# Patient Record
Sex: Male | Born: 1980
Health system: Southern US, Community
[De-identification: ages and names within clinical notes are randomized; demographics above are authoritative.]

## PROBLEM LIST (undated history)

## (undated) DIAGNOSIS — I10 Essential (primary) hypertension: Secondary | ICD-10-CM

## (undated) DIAGNOSIS — F419 Anxiety disorder, unspecified: Secondary | ICD-10-CM

## (undated) DIAGNOSIS — F329 Major depressive disorder, single episode, unspecified: Secondary | ICD-10-CM

## (undated) DIAGNOSIS — F32A Depression, unspecified: Secondary | ICD-10-CM

## (undated) HISTORY — DX: Anxiety disorder, unspecified: F41.9

## (undated) HISTORY — DX: Essential (primary) hypertension: I10

## (undated) HISTORY — DX: Depression, unspecified: F32.A

## (undated) HISTORY — DX: Major depressive disorder, single episode, unspecified: F32.9

---

## 2016-02-03 ENCOUNTER — Encounter: Payer: Self-pay | Admitting: Emergency Medicine

## 2016-02-03 ENCOUNTER — Emergency Department
Admission: EM | Admit: 2016-02-03 | Discharge: 2016-02-03 | Disposition: A | Discharge status: Home / Self Care | Payer: Self-pay | Attending: Emergency Medicine | Admitting: Emergency Medicine

## 2016-02-03 ENCOUNTER — Emergency Department: Payer: Self-pay

## 2016-02-03 DIAGNOSIS — F17210 Nicotine dependence, cigarettes, uncomplicated: Secondary | ICD-10-CM | POA: Insufficient documentation

## 2016-02-03 DIAGNOSIS — L02214 Cutaneous abscess of groin: Secondary | ICD-10-CM | POA: Insufficient documentation

## 2016-02-03 LAB — CBC WITH DIFFERENTIAL/PLATELET
Basophils Absolute: 0.1 10*3/uL (ref 0–0.1)
Basophils Relative: 1 %
EOS ABS: 0.1 10*3/uL (ref 0–0.7)
Eosinophils Relative: 1 %
HCT: 42.3 % (ref 40.0–52.0)
HEMOGLOBIN: 14.6 g/dL (ref 13.0–18.0)
LYMPHS ABS: 2.4 10*3/uL (ref 1.0–3.6)
Lymphocytes Relative: 16 %
MCH: 29.4 pg (ref 26.0–34.0)
MCHC: 34.5 g/dL (ref 32.0–36.0)
MCV: 85.2 fL (ref 80.0–100.0)
MONOS PCT: 10 %
Monocytes Absolute: 1.4 10*3/uL — ABNORMAL HIGH (ref 0.2–1.0)
NEUTROS PCT: 72 %
Neutro Abs: 10.6 10*3/uL — ABNORMAL HIGH (ref 1.4–6.5)
Platelets: 229 10*3/uL (ref 150–440)
RBC: 4.96 MIL/uL (ref 4.40–5.90)
RDW: 14.3 % (ref 11.5–14.5)
WBC: 14.6 10*3/uL — ABNORMAL HIGH (ref 3.8–10.6)

## 2016-02-03 LAB — BASIC METABOLIC PANEL
Anion gap: 5 (ref 5–15)
BUN: 8 mg/dL (ref 6–20)
CHLORIDE: 106 mmol/L (ref 101–111)
CO2: 26 mmol/L (ref 22–32)
CREATININE: 0.95 mg/dL (ref 0.61–1.24)
Calcium: 9.3 mg/dL (ref 8.9–10.3)
GFR calc Af Amer: >60 mL/min (ref >60)
GFR calc non Af Amer: >60 mL/min (ref >60)
GLUCOSE: 166 mg/dL — AB (ref 65–99)
Potassium: 3.7 mmol/L (ref 3.5–5.1)
SODIUM: 137 mmol/L (ref 135–145)

## 2016-02-03 MED ORDER — IOPAMIDOL (ISOVUE-300) INJECTION 61%
100.0000 mL | Freq: Once | INTRAVENOUS | Status: AC | PRN
  Administered 2016-02-03: 100 mL via INTRAVENOUS
  Filled 2016-02-03: qty 100

## 2016-02-03 MED ORDER — SULFAMETHOXAZOLE-TRIMETHOPRIM 800-160 MG PO TABS: 1.0000 | ORAL_TABLET | Freq: Two times a day (BID) | ORAL | 0 refills | Status: DC

## 2016-02-03 MED ORDER — LIDOCAINE-EPINEPHRINE (PF) 1 %-1:200000 IJ SOLN
INTRAMUSCULAR | Status: AC
  Filled 2016-02-03: qty 30

## 2016-02-03 MED ORDER — HYDROCODONE-ACETAMINOPHEN 5-325 MG PO TABS: 1.0000 | ORAL_TABLET | ORAL | 0 refills | Status: DC | PRN

## 2016-02-03 MED ORDER — LIDOCAINE-EPINEPHRINE (PF) 1 %-1:200000 IJ SOLN
30.0000 mL | Freq: Once | INTRAMUSCULAR | Status: DC
  Filled 2016-02-03: qty 30

## 2016-02-03 NOTE — ED Triage Notes (Signed)
Boil to perineal area x 5 days.

## 2016-02-03 NOTE — ED Notes (Signed)
Patient transported to CT 

## 2016-02-03 NOTE — ED Provider Notes (Signed)
[facility] Emergency Department Provider Note   ____________________________________________   First MD Initiated Contact with Patient 02/03/16 1105     (approximate)  I have reviewed the triage vital signs and the nursing notes.   HISTORY  Chief Complaint Abscess   HPI TAMOTSU Curtis Parrish is a 35 y.o. male who presents with left groin abscess x5 days. Became more painful to sit and walk 3 days ago. Has a history of boils and ingrown hairs and usually manages at home with alternating hot and cold sitz baths. Tried sitz bath this time, with no improvement. Currently no drainage has been expressed. Fiance states it feels like there may be 2-3 places that are growing together. Denies any fever, dysuria, penile discharge.    History reviewed. No pertinent past medical history.  There are no active problems to display for this patient.   History reviewed. No pertinent surgical history.  Prior to Admission medications   Medication Sig Start Date End Date Taking? Authorizing Provider  HYDROcodone-acetaminophen (NORCO/VICODIN) 5-325 MG tablet Take 1 tablet by mouth every 4 (four) hours as needed for moderate pain. 02/03/16 02/02/17  Chinita Pester, FNP  sulfamethoxazole-trimethoprim (BACTRIM DS,SEPTRA DS) 800-160 MG tablet Take 1 tablet by mouth 2 (two) times daily. 02/03/16   Chinita Pester, FNP    Allergies Aspirin and Nsaids  No family history on file.  Social History Social History  Substance Use Topics  . Smoking status: Current Every Day Smoker    Packs/day: 1.00    Types: Cigarettes  . Smokeless tobacco: Never Used  . Alcohol use No    Review of Systems Constitutional: No fever/chills Cardiovascular: Denies chest pain. Respiratory: Denies shortness of breath. Gastrointestinal: Patient has chronic abdominal pain and issues with bowel movements, has not noted any change.  Genitourinary: Negative for dysuria, decreased stream, or penile discharge.   Musculoskeletal: Negative for back pain. Skin: Negative for rash. Neurological: Negative for headaches, focal weakness or numbness.   ____________________________________________   PHYSICAL EXAM:  VITAL SIGNS: ED Triage Vitals  Enc Vitals Group     BP 02/03/16 1046 (!) 141/88     Pulse Rate 02/03/16 1046 91     Resp 02/03/16 1046 18     Temp 02/03/16 1046 97.7 F (36.5 C)     Temp Source 02/03/16 1046 Oral     SpO2 02/03/16 1046 98 %     Weight 02/03/16 1046 245 lb (111.1 kg)     Height 02/03/16 1046 5\' 11"  (1.803 m)     Head Circumference --      Peak Flow --      Pain Score 02/03/16 1047 10     Pain Loc --      Pain Edu? --      Excl. in GC? --     Constitutional: Alert and oriented. Well appearing and in no acute distress. Eyes: Conjunctivae are normal.  Head: Atraumatic. Neck: No stridor. Supple, full ROM without pain or difficulty Hematological/Lymphatic/Immunilogical: No inguinal LAD Cardiovascular: Normal rate, regular rhythm. Grossly normal heart sounds.  Good peripheral circulation. Respiratory: Normal respiratory effort.  No retractions. Lungs CTAB. Gastrointestinal: Soft and nontender. No distention.  Genitourinary: No penile swelling or discharge noted at urethral meatus. Musculoskeletal: Full ROM in all extremities without pain or difficulty.  Neurologic:  Normal speech and language. No gross focal neurologic deficits are appreciated. No gait instability. Skin:  Skin is warm, dry and intact. 6 cm area of induration in left groin  [phone] INiKentuckDomingo MadM along the left perineum  bordering the left posterior superior scrotum. This is consistent  with an infected/inflamed sebaceous cyst or inclusion cyst.  2. Mildly prominent inguinal lymph nodes that appear reactive.  3. Exam otherwise unremarkable.      Electronically Signed  By: David Ormond M.D.  On: 02/03/2016 13:26    ____________________________________________   PROCEDURES  Procedure(s) performed:   ..Incision and Drainage Date/Time: 02/03/2016 2:20 PM Performed by: Jakyra Kenealy B Authorized by: Brytnee Bechler B   Consent:    Consent obtained:  Verbal   Consent given by:  Patient   Risks discussed:  Bleeding, infection, pain and incomplete drainage   Alternatives discussed:  No  treatment, referral and alternative treatment Location:    Type:  Abscess   Size:  6 cm   Location: left groin. Pre-procedure details:    Skin preparation:  Betadine Anesthesia (see MAR for exact dosages):    Anesthesia method:  Local infiltration   Local anesthetic:  Lidocaine 1% WITH epi Procedure type:    Complexity:  Complex Procedure details:    Needle aspiration: no     Incision types:  Single straight   Scalpel blade:  11   Wound management:  Probed and deloculated   Drainage:  Purulent   Drainage amount:  Copious   Wound treatment:  Wound left open   Packing materials:  1/4 in iodoform gauze Post-procedure details:    Patient tolerance of procedure:  Tolerated well, no immediate complications    Critical Care performed: No  ____________________________________________   INITIAL IMPRESSION / ASSESSMENT AND PLAN / ED COURSE  Pertinent labs & imaging results that were available during my care of the patient were reviewed by me and considered in my medical decision making (see chart for details).  Patient's abscess incised and drained in ED as described above. Patient given prescription for bactrim x 10 days and Norco for pain control (#10 pills, 0 refills). Patient instructed to follow up in 48 hour for packing removal and wound recheck. Patient giveMKindred Hospital SeattlecedoniaENT>nstrucMacThe Surgery And Endoscopy Center LLCeM<MEMMt Pleasant Surgical Centercedoniacene BrawnMarce [phone] SpMarMKhs Ambulatory Surgical Centercedoniay and CompanyawnLLCMercy [facility] Lorne SkBarrington EMerril<MEAMBellevue HospitalcedoniaSe914-S[facility]650-4803agnus IMarEliM [phone] SeMagMarEli Lilly and CompanyBrawnntuLorne SkeBarrSheffieldgton EllisonirMArizona Eye Institute And Cosmetic Laser CenMCraig HospitalcedoniaMENSelect [phone] -Eli Lilly and Companyx FallsT>edHubbellia a plMarcene BProvidencMSutter Medical Center, Sacramentocedonia Medical Centerawnnn<MUnited Surgery Center Orange LLCEASUREMENT>son  wound care. No other emergency medicine complaints at this time.   Clinical Course     ____________________________________________   FINAL CLINICAL IMPRESSION(S) / ED DIAGNOSES  Final diagnoses:  Abscess of left groin      NEW MEDICATIONS STARTED DURING THIS VISIT:  New Prescriptions   HYDROCODONE-ACETAMINOPHEN (NORCO/VICODIN) 5-325 MG TABLET    Take 1 tablet by mouth every 4 (four) hours as needed for moderate pain.   SULFAMETHOXAZOLE-TRIMETHOPRIM (BACTRIM DS,SEPTRA DS) 800-160 MG TABLET    Take 1 tablet by mouth 2 (two) times daily.     Note:  This document was prepared using Dragon  voice recognition software and may include unintentional dictation errors.   Chinita PesterCari B Albaraa Swingle, FNP 02/03/16 1435    Jeanmarie PlantJames A McShane, MD 02/03/16 [phone]

## 2016-02-05 ENCOUNTER — Encounter: Payer: Self-pay | Admitting: Emergency Medicine

## 2016-02-05 ENCOUNTER — Emergency Department
Admission: EM | Admit: 2016-02-05 | Discharge: 2016-02-05 | Disposition: A | Discharge status: Home / Self Care | Payer: Self-pay | Attending: Emergency Medicine | Admitting: Emergency Medicine

## 2016-02-05 DIAGNOSIS — Z09 Encounter for follow-up examination after completed treatment for conditions other than malignant neoplasm: Secondary | ICD-10-CM

## 2016-02-05 DIAGNOSIS — Z4801 Encounter for change or removal of surgical wound dressing: Secondary | ICD-10-CM | POA: Insufficient documentation

## 2016-02-05 DIAGNOSIS — F17210 Nicotine dependence, cigarettes, uncomplicated: Secondary | ICD-10-CM | POA: Insufficient documentation

## 2016-02-05 DIAGNOSIS — Z5189 Encounter for other specified aftercare: Secondary | ICD-10-CM

## 2016-02-05 NOTE — ED Provider Notes (Signed)
[facility] Provider Note ____________________________________________  Time seen: 1318  I have reviewed the triage vital signs and the nursing notes.  HISTORY  Chief Complaint  Wound Check  HPI Curtis Parrish is a 35 y.o. male presents to the ED for recheckof a previous I&D procedure to his scrotal abscess. The wound was appropriately packed and patient was discharged with prescriptions for pain medicine and antibiotics. He denies any interim complaints at this time. He presents now for removal of the packing as directed.  History reviewed. No pertinent past medical history.  There are no active problems to display for this patient.   History reviewed. No pertinent surgical history.  Prior to Admission medications   Medication Sig Start Date End Date Taking? Authorizing Provider  HYDROcodone-acetaminophen (NORCO/VICODIN) 5-325 MG tablet Take 1 tablet by mouth every 4 (four) hours as needed for moderate pain. 02/03/16 02/02/17  Chinita Pesterari B Triplett, FNP  sulfamethoxazole-trimethoprim (BACTRIM DS,SEPTRA DS) 800-160 MG tablet Take 1 tablet by mouth 2 (two) times daily. 02/03/16   Chinita Pesterari B Triplett, FNP    Allergies Aspirin and Nsaids  No family history on file.  Social History Social History  Substance Use Topics  . Smoking status: Current Every Day Smoker    Packs/day: 1.00    Types: Cigarettes  . Smokeless tobacco: Never Used  . Alcohol use No    Review of Systems  Constitutional: Negative for fever. Gastrointestinal: Negative for abdominal pain, vomiting and diarrhea. Genitourinary: Negative for dysuria. Skin: Negative for rash.  ____________________________________________  PHYSICAL EXAM:  VITAL SIGNS: ED Triage Vitals [02/05/16 1141]  Enc Vitals Group     BP (!) 141/94     Pulse Rate 98     Resp 18     Temp 98 F (36.7 C)     Temp Source Oral     SpO2 98 %     Weight 245 lb (111.1 kg)     Height 5\' 11"  (1.803 m)   Head Circumference      Peak Flow      Pain Score 1     Pain Loc      Pain Edu?      Excl. in GC?     Constitutional: Alert and oriented. Well appearing and in no distress. Head: Normocephalic and atraumatic. GU: Left scrotal incision with packing noted. No local erythema is noted superficially. There is deep, induration palpable. Packing is removed without difficulty.  Skin:  Skin is warm, dry and intact. No rash noted. ____________________________________________  INITIAL IMPRESSION / ASSESSMENT AND PLAN / ED COURSE  Patient with an abscess recheck without interim complaints. He is discharged with instructions to continue to dose antibiosis. 2 prescribed, until completion. He is also encouraged to monitor apply warm compresses to promote healing. [facility] will [facility] or return to the ED as needed.  Clinical Course   ____________________________________________  FINAL CLINICAL IMPRESSION(S) / ED DIAGNOSES  Final diagnoses:  Abscess re-check  Encounter for recheck of abscess following incision and drainage      Lissa HoardJenise V Bacon Kiyla Ringler, PA-C 02/05/16 1428    Jene Everyobert Kinner, MD 02/05/16 1501

## 2016-02-05 NOTE — ED Notes (Signed)
Pt in via triage; states he is just here for a wound recheck and for packing to be removed from left groin.  Pt with no complaints at this time.  Pt A/Ox4, no immediate distress noted.

## 2016-02-05 NOTE — Discharge Instructions (Signed)
Keep the wound clean, dry, and covered as necessary. Continued dosing the previously prescribed antibiotic until all pills are gone. [facility].

## 2016-02-05 NOTE — ED Triage Notes (Signed)
Pt comes into the ED via POV for a wound recheck on his groin abscess.  Denies any increased redness, swelling, or tenderness.  Patient has continued his antibiotics but has not completed them yet.

## 2016-09-15 ENCOUNTER — Encounter: Payer: Self-pay | Admitting: Family Medicine

## 2016-09-15 ENCOUNTER — Other Ambulatory Visit: Payer: Self-pay | Admitting: Family Medicine

## 2016-09-15 ENCOUNTER — Ambulatory Visit (INDEPENDENT_AMBULATORY_CARE_PROVIDER_SITE_OTHER): Payer: 59 | Admitting: Family Medicine

## 2016-09-15 VITALS — BP 138/90 | HR 75 | Temp 98.0°F | Resp 16 | Ht 71.0 in | Wt 250.0 lb

## 2016-09-15 DIAGNOSIS — F418 Other specified anxiety disorders: Secondary | ICD-10-CM

## 2016-09-15 DIAGNOSIS — R739 Hyperglycemia, unspecified: Secondary | ICD-10-CM

## 2016-09-15 DIAGNOSIS — Z720 Tobacco use: Secondary | ICD-10-CM

## 2016-09-15 DIAGNOSIS — I10 Essential (primary) hypertension: Secondary | ICD-10-CM | POA: Diagnosis not present

## 2016-09-15 DIAGNOSIS — Z7689 Persons encountering health services in other specified circumstances: Secondary | ICD-10-CM

## 2016-09-15 DIAGNOSIS — Z114 Encounter for screening for human immunodeficiency virus [HIV]: Secondary | ICD-10-CM

## 2016-09-15 DIAGNOSIS — E669 Obesity, unspecified: Secondary | ICD-10-CM

## 2016-09-15 DIAGNOSIS — Z0000 Encounter for general adult medical examination without abnormal findings: Secondary | ICD-10-CM

## 2016-09-15 DIAGNOSIS — F339 Major depressive disorder, recurrent, unspecified: Secondary | ICD-10-CM | POA: Insufficient documentation

## 2016-09-15 DIAGNOSIS — F331 Major depressive disorder, recurrent, moderate: Secondary | ICD-10-CM | POA: Insufficient documentation

## 2016-09-15 MED ORDER — FLUOXETINE HCL 20 MG PO TABS: 20.0000 mg | ORAL_TABLET | Freq: Every day | ORAL | 2 refills | Status: DC

## 2016-09-15 MED ORDER — AMLODIPINE BESYLATE 5 MG PO TABS: 5.0000 mg | ORAL_TABLET | Freq: Every day | ORAL | 2 refills | Status: DC

## 2016-09-15 NOTE — Progress Notes (Signed)
Subjective:    Patient ID: Curtis Parrish, male    DOB: Jan 05, 1981, 36 y.o.   MRN: [id number]  Curtis Parrish is a 36 y.o. 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 Here to establish care and requesting evaluation and doctors note for work for clearance to return to regular duty.  HPI   CHRONIC HTN: Reports diagnosed with HTN over past 3 months, but has had high readings previously  - He went to outside Urgent Care 3 months ago in February 2018 due to spells at work with dizzy spells and felt "near-syncope" but never passed out. They requested blood tests but he did not do because of financial reasons.  - Now he has resumed work, with restrictions based on note written from Urgent Care. However, he is able to perform all of his normal daily job functions regardless of restriction. He has "modified" his activities and movements and is approaching his job more "safely" and is more cautious of his movements, not rushing or over-exerting or improperly lifting (before was triggered by frequent lifting and forward bending in too close succession) Current Meds - Was treated with Amlodipine , given 30 day supply initially but ran out, he got additional medications Amlodipine  from friend but he ran out of this 2 days ago. Did not take pill today. Lifestyle: - Limited regular exercise - Family history of HTN, HLD (father, mother, and sister HTN). No known heart disease or MI. - Caffeine intake high amount with energy drinks previously up to 120 oz daily now down to 64 oz daily, limited water intake still Admits occasional mid chest pressure without pain, worst with exertion, temporary episodes, has had some non-exertional symptoms at times. Has never seen Cardiologist. No history of MI. - Requesting doctors note if cleared to  resume regular duty by 09/22/16 Denies chest pain, dyspnea, HA, edema, dizziness / lightheadedness  History of hyperglycemia - No known history of Pre-DM or DM. Has not had regular medical follow-up, only recent labs available include Hyperglycemia at glucose 166, non fasting in ED with acute abscess infection, admits to high sugar diet as well. - Admits polyuria, he attributes this to the amlodipine BP pill - Familiy history with mother possibly dx DM or Pre-DM  Chronic Anxiety / Depression: - Reviews prior history with problem initially diagnosed >10 years ago, related to triggers of acute stress with divorce and custody battle for children. He had labile mood and mixed symptoms with agitation, with anxiety and depression. Additionally he served some time in [employer], concerns with some things he saw, first time [place] body [place], has had some flashbacks and is concerned about PTSD.  - Previously treated with Zoloft, unable to recall exact dose but was on for months to years, dose up to  by report. Rx by Psychiatry, [place]. Last dose 2011, due to loss of insurance, has been off anti-depressant since then. - He has improved controlling his depression with coping mechanisms, but anxiety is persistently bothering him on regular basis, hard to stay focused and he can dwelling, persistent problem no recent changes - Interested to resume anti-depressant today, thinks he may have "gotten used" to zoloft  PMH - Tobacco Abuse, interested in smoking cessation, failed NRT gum. Agrees to discuss this next visit.  Defer HM for  annual physical next week.  Depression screen PHQ 2/9 09/15/2016  Decreased Interest 1  Down, Depressed, Hopeless 2  PHQ - 2 Score 3  Altered sleeping 3  Tired, decreased energy 2  Change in appetite 2  Feeling bad or failure about yourself  1  Trouble concentrating 1  Moving slowly or fidgety/restless 0  Suicidal thoughts 0  PHQ-9 Score 12    Difficult doing work/chores Somewhat difficult   GAD 7 : Generalized Anxiety Score 09/15/2016  Nervous, Anxious, on Edge 2  Control/stop worrying 1  Worry too much - different things 2  Trouble relaxing 2  Restless 1  Easily annoyed or irritable 2  Afraid - awful might happen 1  Total GAD 7 Score 11  Anxiety Difficulty Somewhat difficult    Past Medical History:  Diagnosis Date  . Anxiety   . Depression   . Hypertension    No past surgical history on file. Social History   Social History  . Marital status: Single    Spouse name: N/A  . Number of children: N/A  . Years of education: N/A   Occupational History  . Not on file.   Social History Main Topics  . Smoking status: Current Every Day Smoker    Packs/day: 1.00    Types: Cigarettes  . Smokeless tobacco: Current User  . Alcohol use Yes  . Drug use: No  . Sexual activity: Not on file   Other Topics Concern  . Not on file   Social History Narrative  . No narrative on file   Family History  Problem Relation Age of Onset  . Diabetes Mother   . Hypertension Sister    No current outpatient prescriptions on file prior to visit.   No current facility-administered medications on file prior to visit.     Review of Systems  Constitutional: Positive for activity change (>3 months with some decreased activity tolerance). Negative for appetite change, chills, diaphoresis, fatigue, fever and unexpected weight change.  HENT: Negative for congestion, hearing loss and sinus pressure.   Eyes: Negative for visual disturbance.  Respiratory: Negative for apnea, cough, chest tightness, shortness of breath and wheezing.   Cardiovascular: Negative for chest pain, palpitations and leg swelling.  Gastrointestinal: Negative for abdominal pain, anal bleeding, blood in stool, constipation, diarrhea, nausea and vomiting.  Endocrine: Positive for polyuria. Negative for cold intolerance.  Genitourinary: Negative for decreased urine  volume, difficulty urinating, dysuria, frequency, hematuria, testicular pain and urgency.       Admits Erectile dysfunction  Musculoskeletal: Negative for arthralgias, back pain, joint swelling and neck pain.  Skin: Negative for rash.  Allergic/Immunologic: Negative for environmental allergies.  Neurological: Positive for dizziness (history of dizzy spells with exertion and/or certain sudden position changes). Negative for weakness, light-headedness, numbness and headaches.  Hematological: Negative for adenopathy.  Psychiatric/Behavioral: Positive for agitation, decreased concentration, dysphoric mood and sleep disturbance. Negative for behavioral problems, confusion, self-injury and suicidal ideas. The patient is nervous/anxious.    Per HPI unless specifically indicated above     Objective:    BP 138/90 (BP Location: Left Arm, Cuff Size: Normal)   Pulse 75   Temp 98 F (36.7 C) (Oral)   Resp 16   Ht  (1.803 m)   Wt 250 lb (113.4 kg)   BMI 34.87 kg/m   Wt Readings from Last 3 Encounters:  09/15/16 250 lb (113.4 kg)  02/05/16 245 lb (111.1 kg)  02/03/16 245 lb (111.1 kg)    Physical  Exam  Constitutional: He is oriented to person, place, and time. He appears well-developed and well-nourished. No distress.  Well-appearing, comfortable, cooperative, obese  HENT:  Head: Normocephalic and atraumatic.  Mouth/Throat: Oropharynx is clear and moist.  Poor dentition, some missing teeth other decay  Frontal / maxillary sinuses non-tender. Nares patent without purulence or edema. Bilateral TMs clear without erythema, effusion or bulging. Oropharynx mild dry mucus mem, clear without erythema, exudates, edema or asymmetry.  Eyes: Conjunctivae and EOM are normal. Pupils are equal, round, and reactive to light.  Neck: Normal range of motion. Neck supple. No thyromegaly present.  No carotid bruits  Cardiovascular: Normal rate, regular rhythm, normal heart sounds and intact distal pulses.    No murmur heard. Pulmonary/Chest: Effort normal and breath sounds normal. No respiratory distress. He has no wheezes. He has no rales.  Abdominal: Soft. He exhibits no distension.  Musculoskeletal: Normal range of motion. He exhibits no edema.  Lymphadenopathy:    He has no cervical adenopathy.  Neurological: He is alert and oriented to person, place, and time.  Distal sensation to light touch intact  Skin: Skin is warm and dry. No rash noted. He is not diaphoretic. No erythema.  Psychiatric: He has a normal mood and affect. His behavior is normal.  Well groomed, good eye contact, normal speech and thoughts, does not appear anxious  Nursing note and vitals reviewed.    I have personally reviewed the following lab results from 01/2016.  Results for orders placed or performed during the hospital encounter of 02/03/16  CBC with Differential  Result Value Ref Range   WBC 14.6 (H) 3.8 - 10.6 K/uL   RBC 4.96 4.40 - 5.90 MIL/uL   Hemoglobin 14.6 13.0 - 18.0 g/dL   HCT 16.1 09.6 - 04.5 %   MCV 85.2 80.0 - 100.0 fL   MCH 29.4 26.0 - 34.0 pg   MCHC 34.5 32.0 - 36.0 g/dL   RDW 40.9 81.1 - 91.4 %   Platelets 229 150 - 440 K/uL   Neutrophils Relative % 72 %   Neutro Abs 10.6 (H) 1.4 - 6.5 K/uL   Lymphocytes Relative 16 %   Lymphs Abs 2.4 1.0 - 3.6 K/uL   Monocytes Relative 10 %   Monocytes Absolute 1.4 (H) 0.2 - 1.0 K/uL   Eosinophils Relative 1 %   Eosinophils Absolute 0.1 0 - 0.7 K/uL   Basophils Relative 1 %   Basophils Absolute 0.1 0 - 0.1 K/uL  Basic metabolic panel  Result Value Ref Range   Sodium 137 135 - 145 mmol/L   Potassium 3.7 3.5 - 5.1 mmol/L   Chloride 106 101 - 111 mmol/L   CO2 26 22 - 32 mmol/L   Glucose, Bld 166 (H) 65 - 99 mg/dL   BUN 8 6 - 20 mg/dL   Creatinine, Ser 7.82 0.61 - 1.24 mg/dL   Calcium 9.3 8.9 - 95.6 mg/dL   GFR calc non Af Amer >60 >60 mL/min   GFR calc Af Amer >60 >60 mL/min   Anion gap 5 5 - 15      Assessment & Plan:   Problem List Items  Addressed This Visit    Tobacco abuse    Discuss at next visit for more smoking cessation management, prior failed NRT with gum.      Obesity (BMI 30.0-34.9)    Weight gain +5  Lbs in 8 months, some fluctuation, attributed to poor lifestyle Encouraged lifestyle changes, stop energy drinks, reduce portion,  inc water, start regular exercise Check fasting labs, follow-up 1 week annual physical      Hypertension - Primary    Initial BP elevated, improved on manual re-check, currently off anti-HTN med (ran out, was only 30 day supply in 06/2016, then taking friends medicine), by history chronic HTN uncontrolled due to lack of ins and medical care, family history strong HTN. - Concern with mixed exertional symptoms at work, with dizzy spells vs lightheaded, vs transient chest pressure, seems improved on anti-HTN therapy and with activity modification - No known complications, no recent labs - Asymptomatic currently  Plan: 1. Refill Amlodipine  daily #30 day +2 refills for now 2. Request to keep BP log outside office, bring to next visit - if >140/90 consistent can increase dose as tolerated 3. Encourage improve diet and exercise as tolerated, strong emphasis on reducing energy drinks completely if possible, may contribute to his current symptoms and raise BP 4. Since functioning currently at job by report with improved symptoms now on anti-HTN therapy, agree to release him back to full duty starting 09/22/16, he is to return soon within 1 week with fasting labs, and Annual Physical before returning for re-evaluation. Discussed concerns with his prior exertional symptoms would recommend referral to Cardiology for stress testing and cardiac evaluation. He agrees to this in near future but declines referral at this time.      Relevant Medications   amLODipine (NORVASC) 5 MG tablet   Hyperglycemia    Limited history, prior non fasting glucose 166 ED with acute infection No prior A1c Possible  Mother with DM/PreDM Check fasting labs within 1 week, including A1c chemistry      Anxiety associated with depression    Chronic anxiety vs GAD with associated depression vs possible PTSD. - Triggers with life stressors, divorce, custody, military flashbacks, financial -GAD7: 11, somewhat difficult / PHQ9: 12 somewhat difficult - Prior med: Zoloft reported up to  last dose 2011 (ineffective after a while) - [place] psychiat[place] of state  Plan: 1. Discussion on anxiety/depression, management, complications, concerns with PTSD 2. Start Fluoxetine  daily AM with food, counseling on potential side effects risks, reviewed possible GI intolerance, insomnia (although likely to improve this given anxiety likely source of insomnia), sexual dysfunction (already has ED), reviewed black box warning inc suicidal (no prior history, unlikely concern) - anticipate 4-6 weeks for notable effect, may need titrate dose to 20 in future 3. Advised recommend therapy / counseling in future - if needed will consider Psychiatry referral 4. Follow-up 1 week for annual physical, notify office 4-6 week if need to adjust med      Relevant Medications   FLUoxetine (PROZAC) 20 MG tablet    Other Visit Diagnoses    Encounter to establish care with new doctor          Meds ordered this encounter  Medications  . DISCONTD: amLODipine (NORVASC) 5 MG tablet    Sig: Take 5 mg by mouth daily.  Marland Kitchen amLODipine (NORVASC) 5 MG tablet    Sig: Take 1 tablet (5 mg total) by mouth daily.    Dispense:  30 tablet    Refill:  2  . FLUoxetine (PROZAC) 20 MG tablet    Sig: Take 1 tablet (20 mg total) by mouth daily.    Dispense:  30 tablet    Refill:  2      Follow up plan: Return in about 1 week (around 09/22/2016) for  week for Annual Physical, prefer Tuesday  Sep 21 2016.  Saralyn Pilar, DO [facility] 09/16/2016, 12:26 AM

## 2016-09-15 NOTE — Patient Instructions (Signed)
Thank you for coming to the clinic today.  1. New medications - Refilled Amlodipine take daily - if can check BP few times week if possible, if >140/90 consistently, let our office know, may need to double dose up to  daily  2. Blood work tomorrow, fasting, [facility] - will review results at next apt in 1 week  3. Start Fluoxetine  daily - can cause some stomach upset, take with food, may take 4-6 weeks for full effect, in future may adjust dose - If not making progress consider other medications, also Psychiatry if needed for PTSD  Please schedule a follow-up appointment with Dr. Althea Charon in 1 week for Annual Physical, prefer Tuesday Sep 21 2016  If you have any other questions or concerns, please feel free to call the clinic or send a message through MyChart. You may also schedule an earlier appointment if necessary.  Saralyn Pilar, DO [facility], [place]

## 2016-09-16 ENCOUNTER — Encounter: Payer: Self-pay | Admitting: Family Medicine

## 2016-09-16 DIAGNOSIS — N529 Male erectile dysfunction, unspecified: Secondary | ICD-10-CM | POA: Insufficient documentation

## 2016-09-16 DIAGNOSIS — I10 Essential (primary) hypertension: Secondary | ICD-10-CM | POA: Diagnosis not present

## 2016-09-16 DIAGNOSIS — Z0000 Encounter for general adult medical examination without abnormal findings: Secondary | ICD-10-CM | POA: Diagnosis not present

## 2016-09-16 NOTE — Assessment & Plan Note (Signed)
Chronic anxiety vs GAD with associated depression vs possible PTSD. - Triggers with life stressors, divorce, custody, military flashbacks, financial -GAD7: 11, somewhat difficult / PHQ9: 12 somewhat difficult - Prior med: Zoloft reported up to 200mg  last dose 2011 (ineffective after a while) - [place] psychiat[place] of state  Plan: 1. Discussion on anxiety/depression, management, complications, concerns with PTSD 2. Start Fluoxetine 20mg  daily AM with food, counseling on potential side effects risks, reviewed possible GI intolerance, insomnia (although likely to improve this given anxiety likely source of insomnia), sexual dysfunction (already has ED), reviewed black box warning inc suicidal (no prior history, unlikely concern) - anticipate 4-6 weeks for notable effect, may need titrate dose to 20 in future 3. Advised recommend therapy / counseling in future - if needed will consider Psychiatry referral 4. Follow-up 1 week for annual physical, notify office 4-6 week if need to adjust med

## 2016-09-16 NOTE — Assessment & Plan Note (Signed)
Discuss at next visit for more smoking cessation management, prior failed NRT with gum.

## 2016-09-16 NOTE — Assessment & Plan Note (Signed)
Weight gain +5  Lbs in 8 months, some fluctuation, attributed to poor lifestyle Encouraged lifestyle changes, stop energy drinks, reduce portion, inc water, start regular exercise Check fasting labs, follow-up 1 week annual physical

## 2016-09-16 NOTE — Assessment & Plan Note (Signed)
Limited history, prior non fasting glucose 166 ED with acute infection No prior A1c Possible Mother with DM/PreDM Check fasting labs within 1 week, including A1c chemistry

## 2016-09-16 NOTE — Assessment & Plan Note (Addendum)
Initial BP elevated, improved on manual re-check, currently off anti-HTN med (ran out, was only 30 day supply in 06/2016, then taking friends medicine), by history chronic HTN uncontrolled due to lack of ins and medical care, family history strong HTN. - Concern with mixed exertional symptoms at work, with dizzy spells vs lightheaded, vs transient chest pressure, seems improved on anti-HTN therapy and with activity modification - No known complications, no recent labs - Asymptomatic currently  Plan: 1. Refill Amlodipine 5mg  daily #30 day +2 refills for now 2. Request to keep BP log outside office, bring to next visit - if >140/90 consistent can increase dose as tolerated 3. Encourage improve diet and exercise as tolerated, strong emphasis on reducing energy drinks completely if possible, may contribute to his current symptoms and raise BP 4. Since functioning currently at job by report with improved symptoms now on anti-HTN therapy, agree to release him back to full duty starting 09/22/16, he is to return soon within 1 week with fasting labs, and Annual Physical before returning for re-evaluation. Discussed concerns with his prior exertional symptoms would recommend referral to Cardiology for stress testing and cardiac evaluation. He agrees to this in near future but declines referral at this time.

## 2016-09-17 ENCOUNTER — Telehealth: Payer: Self-pay

## 2016-09-17 LAB — COMPREHENSIVE METABOLIC PANEL
A/G RATIO: 1.3 (ref 1.2–2.2)
ALK PHOS: 82 IU/L (ref 39–117)
ALT: 40 IU/L (ref 0–44)
AST: 20 IU/L (ref 0–40)
Albumin: 4.3 g/dL (ref 3.5–5.5)
BUN/Creatinine Ratio: 10 (ref 9–20)
BUN: 9 mg/dL (ref 6–20)
Bilirubin Total: 0.3 mg/dL (ref 0.0–1.2)
CALCIUM: 9.7 mg/dL (ref 8.7–10.2)
CO2: 24 mmol/L (ref 18–29)
Chloride: 99 mmol/L (ref 96–106)
Creatinine, Ser: 0.93 mg/dL (ref 0.76–1.27)
GFR calc Af Amer: 120 mL/min/{1.73_m2} (ref >59)
GFR, EST NON AFRICAN AMERICAN: 104 mL/min/{1.73_m2} (ref >59)
Globulin, Total: 3.3 g/dL (ref 1.5–4.5)
Glucose: 124 mg/dL — ABNORMAL HIGH (ref 65–99)
POTASSIUM: 4.5 mmol/L (ref 3.5–5.2)
Sodium: 138 mmol/L (ref 134–144)
Total Protein: 7.6 g/dL (ref 6.0–8.5)

## 2016-09-17 LAB — CBC WITH DIFFERENTIAL/PLATELET
Basophils Absolute: 0.1 10*3/uL (ref 0.0–0.2)
Basos: 1 %
EOS (ABSOLUTE): 0.3 10*3/uL (ref 0.0–0.4)
EOS: 3 %
HEMOGLOBIN: 15 g/dL (ref 13.0–17.7)
Hematocrit: 42.8 % (ref 37.5–51.0)
IMMATURE GRANS (ABS): 0 10*3/uL (ref 0.0–0.1)
IMMATURE GRANULOCYTES: 0 %
LYMPHS: 37 %
Lymphocytes Absolute: 3.4 10*3/uL — ABNORMAL HIGH (ref 0.7–3.1)
MCH: 29 pg (ref 26.6–33.0)
MCHC: 35 g/dL (ref 31.5–35.7)
MCV: 83 fL (ref 79–97)
MONOCYTES: 10 %
Monocytes Absolute: 0.9 10*3/uL (ref 0.1–0.9)
NEUTROS PCT: 49 %
Neutrophils Absolute: 4.5 10*3/uL (ref 1.4–7.0)
Platelets: 296 10*3/uL (ref 150–379)
RBC: 5.18 x10E6/uL (ref 4.14–5.80)
RDW: 13.1 % (ref 12.3–15.4)
WBC: 9.1 10*3/uL (ref 3.4–10.8)

## 2016-09-17 LAB — HEMOGLOBIN A1C
Est. average glucose Bld gHb Est-mCnc: 166 mg/dL
Hgb A1c MFr Bld: 7.4 % — ABNORMAL HIGH (ref 4.8–5.6)

## 2016-09-17 LAB — LIPID PANEL
CHOL/HDL RATIO: 6 ratio — AB (ref 0.0–5.0)
CHOLESTEROL TOTAL: 187 mg/dL (ref 100–199)
HDL: 31 mg/dL — AB (ref >39)
LDL Calculated: 124 mg/dL — ABNORMAL HIGH (ref 0–99)
TRIGLYCERIDES: 161 mg/dL — AB (ref 0–149)
VLDL Cholesterol Cal: 32 mg/dL (ref 5–40)

## 2016-09-17 LAB — HIV ANTIBODY (ROUTINE TESTING W REFLEX): HIV SCREEN 4TH GENERATION: NONREACTIVE

## 2016-09-17 NOTE — Telephone Encounter (Signed)
Patient advised as below. Patient verbalizes understanding and is in agreement with treatment plan.  

## 2016-09-17 NOTE — Telephone Encounter (Signed)
-----   Message from Smitty CordsAlexander J Karamalegos, DO sent at 09/17/2016  6:59 AM EDT ----- Lab results released to MyChart with comments for patient.  Will review in detail at office visit next week.  1. Chemistry panel - Normal electrolytes, kidney and liver function. Fasting blood sugar was 124 (elevated).  2. Hemoglobin A1c (Diabetes Screening) - 7.4. Abnormal result, this is actually in the "Diabetes" range, >6.5. I am concerned you may have Type 2 Diabetes, however we need to confirm this with a repeat A1c test in about 1-2 weeks to see if the result was accurate. That single test will not require you to be fasting, we can order lab at next visit and you may go to [facility].  3. Routine HIV Screen - Negative.  4. Cholesterol (Lipid) Panel - Abnormal results. We can review cholesterol in office. I do recommend starting daily OTC baby Aspirin 81mg  daily to reduce risk of heart attack and stroke for now.  5. CBC Blood Count - Normal  Saralyn PilarAlexander Karamalegos, DO [facility] 09/17/2016, 6:59 AM

## 2016-09-21 ENCOUNTER — Encounter: Payer: Self-pay | Admitting: Family Medicine

## 2016-09-21 ENCOUNTER — Ambulatory Visit (INDEPENDENT_AMBULATORY_CARE_PROVIDER_SITE_OTHER): Payer: 59 | Admitting: Family Medicine

## 2016-09-21 VITALS — BP 136/83 | HR 81 | Temp 98.2°F | Resp 16 | Ht 71.0 in | Wt 252.0 lb

## 2016-09-21 DIAGNOSIS — E1165 Type 2 diabetes mellitus with hyperglycemia: Secondary | ICD-10-CM | POA: Insufficient documentation

## 2016-09-21 DIAGNOSIS — N5089 Other specified disorders of the male genital organs: Secondary | ICD-10-CM

## 2016-09-21 DIAGNOSIS — I10 Essential (primary) hypertension: Secondary | ICD-10-CM | POA: Diagnosis not present

## 2016-09-21 DIAGNOSIS — F418 Other specified anxiety disorders: Secondary | ICD-10-CM | POA: Diagnosis not present

## 2016-09-21 DIAGNOSIS — L732 Hidradenitis suppurativa: Secondary | ICD-10-CM

## 2016-09-21 DIAGNOSIS — Z720 Tobacco use: Secondary | ICD-10-CM | POA: Diagnosis not present

## 2016-09-21 DIAGNOSIS — E782 Mixed hyperlipidemia: Secondary | ICD-10-CM

## 2016-09-21 DIAGNOSIS — R7309 Other abnormal glucose: Secondary | ICD-10-CM

## 2016-09-21 DIAGNOSIS — Z0000 Encounter for general adult medical examination without abnormal findings: Secondary | ICD-10-CM | POA: Diagnosis not present

## 2016-09-21 MED ORDER — DOXYCYCLINE HYCLATE 100 MG PO TABS: 100.0000 mg | ORAL_TABLET | Freq: Two times a day (BID) | ORAL | 0 refills | Status: DC

## 2016-09-21 NOTE — Patient Instructions (Addendum)
Thank you for coming to the clinic today.  1. - Continue Amlodipine 5mg  daily - keep checking BP few times a week if possible once you get your cuff, if >140/90 consistently, let our office know, may need to double dose up to 10mg  daily (2 pills of the 5mg  at once for 1-2 weeks)  For cyst - Start antibiotic Doxycycline twice daily for 10 days, if worsening redness, swelling, more pus, fevers/chills, or other concerns, let me know or seek more immediate treatment  Regarding question on shingles, if wife's lesions rash have resolved, then you are at very low risk for transmission, as you had chicken pox already exposed, immunocompromise will increase your risk  Continue Prozac as you are for 4-6 weeks, in future we can adjust, may call office if need to extend this rx or change dose  Think about setting quit date for smoking, likely Fall 2018, and we can revisit Wellbutrin  2. Concern with Elevated A1c (in range for Diabetes), printed NEW order for [facility] - Hemoglobin A1c - please go again NON Fasting, anytime in next 1-2 weeks - If still in diabetes range, then will send new rx Metformin 500mg  1-2 times daily with food, if upset stomach then stay at 1 dose daily for few weeks, if still cannot tolerate even once daily, notify office and we can change to XL 24 hour pill that is better tolerated. - WIll also take significant lifestyle changes as well - in future can refer to Nutritionist  Eat at least 3 meals and 1-2 snacks per day (don't skip breakfast).  Aim for no more than 5 hours between eating. - Tip: If you go >5 hours without eating and become very hungry, your body will supply it's own resources temporarily and you can gain extra weight when you eat.  The 5 Minute Rule of Exercise - Promise yourself to at least do 5 minutes of exercise (make sure you time it), and if at the end of 5 minutes (this is the hardest part of the work-out), if you still feel like you want stop (or not motivated  to continue) then allow yourself to stop. Otherwise, more often than not you will feel encouraged that you can continue for a little while longer or even more!  Diet Recommendations for Diabetes   Reduce Starchy (carb) foods include: Bread, rice, pasta, potatoes, corn, crackers, bagels, muffins, all baked goods.   Keep eating Protein foods include: Meat, fish, poultry, eggs, dairy foods, and beans such as pinto and kidney beans (beans also provide carbohydrate).   1. Eat at least 3 meals and 1-2 snacks per day. Never go more than 4-5 hours while awake without eating.   2. Limit starchy foods to TWO per meal and ONE per snack. ONE portion of a starchy  food is equal to the following:   - ONE slice of bread (or its equivalent, such as half of a hamburger bun).   - 1/2 cup of a "scoopable" starchy food such as potatoes or rice.   - 1 OUNCE (28 grams) of starchy snacks (crackers or pretzels, look on label).   - 15 grams of carbohydrate as shown on food label.   3. Both lunch and dinner should include a protein food, a carb food, and vegetables.   - Obtain twice as many veg's as protein or carbohydrate foods for both lunch and dinner.   - Try to keep frozen veg's on hand for a quick vegetable serving.     -  Fresh or frozen veg's are best.   4. Breakfast should always include protein.    Please schedule a follow-up appointment with Dr. Althea CharonKaramalegos in 4-6 weeks as needed for Anxiety/Depression / HTN / Blood Sugar / OR if doing better you can follow-up in 3 months for Anxiety/Depression / Smoking OR HTN / DM A1c  If you have any other questions or concerns, please feel free to call the clinic or send a message through MyChart. You may also schedule an earlier appointment if necessary.  Saralyn PilarAlexander Karamalegos, DO [facility], [place]

## 2016-09-21 NOTE — Progress Notes (Signed)
Subjective:    Patient ID: Curtis Parrish, male    DOB: 1980-05-30, [age] y.o.   MRN: [id number]  Curtis Parrish is a 36 y.o. male presenting on 09/21/2016 for Annual Exam   HPI   Perineal Cyst, s/p ruptured and drainage spontaneous / History of Hidradenititis Suppurativa - Reports new complaint today of L perineal region cyst that has spontaneously drained last night, large volume purulent sebaceous material. Has prior history of recurrent draining cysts, some required I&D in past - today asking about antibiotic to prevent it from becoming infected, he has history of MRSA - Denies redness, other painful lesions or cysts, tenderness, bleeding, fevers/chills  CHRONIC HTN / Hyperlipidemia Mixed / History of Exertional Symptoms - Since last visit 09/16/16, has not had a chance to get BP cuff yet, will start checking soon. Attributes some elevated BP to significant stressors recently family, anxiety among others - He did start taking Aspirin ASA 81mg  daily after receiving lab results with abnormal cholesterol and concern for possible new DM. He was unaware that ASA was NSAID and he has a chronic history to NSAIDs with hives. After each dose of ASA 81 he would get several raised red hives and some itching, lasted for about 4-5 hours then resolved, he kept taking medicine for few days, even took one today. He admits that he forgot to mention his NSAID allergy - Currently feels fine without further symptoms that he was experiencing before that kept him out of work, no further dizzy spells and no dyspnea or chest pain/pressure on exertion. He will resume full duty at work tomorrow, has work note. - He is interested in a Cardiology evaluation at some point but not currently due to several financial stressors and unable to take time away from work  Hyperglycemia / Elevated A1c - Concern New Diabetes Type 2 - Last visit 09/16/16, had labs with result A1c 7.4, in diabetic range, he has no prior A1c available, did  have prior non fasting glucose in 2017 with 166, recent glucose on 09/16/16 was 124. No known history of Pre-DM or DM. 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 [employer] - Admits polyuria - Familiy history with mother possibly dx DM or Pre-DM  Chronic Anxiety / Depression / Possible PTSD - Last visit 09/15/16, see background information on this problem, Zoloft in past may have become ineffective, he was started on Fluoxetine 20mg  daily, he tried it for few days and seemed to trigger migraine headache initially, then he started cutting pill in half, take one half in AM and other 4-5 hours later, with better result - He has no significant change in symptoms, believes it is too early to tell if med is helping at this dose - Denies any worsening mood, suicidal or homicidal ideation on this med  Tobacco Abuse - Active smoker 1 ppd >30 years - Limited quit attempts before, only days to weeks at max, tried cold [place] and NRT gum but had difficulty chewing it due to very poor dentition. Interested in rx medication such as Wellbutrin. - Admits significant stress recently, not ready to quit   Past Medical History:  Diagnosis Date  . Anxiety   . Depression   . Hypertension    No past surgical history on file. Social History   Social History  . Marital status: Single    Spouse  name: N/A  . Number of children: N/A  . Years of education: N/A   Occupational History  . Not on file.   Social History Main Topics  . Smoking status: Current Every Day Smoker    Packs/day: 1.00    Years: 30.00    Types: Cigarettes  . Smokeless tobacco: Current User  . Alcohol use No     Comment: Abstaining from alcohol for 15 years, prior history in age 32s  . Drug use: No  . Sexual activity: Not on file   Other Topics Concern  . Not on file   Social  History Narrative  . No narrative on file   Family History  Problem Relation Age of Onset  . Diabetes Mother   . Hypertension Sister    Current Outpatient Prescriptions on File Prior to Visit  Medication Sig  . amLODipine (NORVASC) 5 MG tablet Take 1 tablet (5 mg total) by mouth daily.  Marland Kitchen FLUoxetine (PROZAC) 20 MG tablet Take 1 tablet (20 mg total) by mouth daily.   No current facility-administered medications on file prior to visit.     Review of Systems  Constitutional: Positive for activity change (>3 months with some decreased activity tolerance). Negative for appetite change, chills, diaphoresis, fatigue, fever and unexpected weight change.  HENT: Negative for congestion, hearing loss and sinus pressure.        R ear chronic hearing loss, from firing guns in [employer]  Eyes: Negative for visual disturbance.  Respiratory: Negative for apnea, cough, chest tightness, shortness of breath and wheezing.   Cardiovascular: Negative for chest pain, palpitations and leg swelling.  Gastrointestinal: Negative for abdominal pain, anal bleeding, blood in stool, constipation, diarrhea, nausea and vomiting.  Endocrine: Positive for polyuria. Negative for cold intolerance.  Genitourinary: Negative for decreased urine volume, difficulty urinating, dysuria, frequency, hematuria, testicular pain and urgency.       Admits Erectile dysfunction  Musculoskeletal: Negative for arthralgias, back pain, joint swelling and neck pain.  Skin: Negative for rash.  Allergic/Immunologic: Negative for environmental allergies.  Neurological: Positive for dizziness (history of dizzy spells with exertion and/or certain sudden position changes). Negative for weakness, light-headedness, numbness and headaches.  Hematological: Negative for adenopathy.  Psychiatric/Behavioral: Positive for agitation, decreased concentration, dysphoric mood and sleep disturbance. Negative for behavioral problems, confusion, self-injury and  suicidal ideas. The patient is nervous/anxious.    Per HPI unless specifically indicated above     Objective:    BP 136/83   Pulse 81   Temp 98.2 F (36.8 C) (Oral)   Resp 16   Ht 5\' 11"  (1.803 m)   Wt 252 lb (114.3 kg)   BMI 35.15 kg/m   Wt Readings from Last 3 Encounters:  09/21/16 252 lb (114.3 kg)  09/15/16 250 lb (113.4 kg)  02/05/16 245 lb (111.1 kg)    Physical Exam  Constitutional: He is oriented to person, place, and time. He appears well-developed and well-nourished. No distress.  Well-appearing, comfortable, cooperative, obese  HENT:  Head: Normocephalic and atraumatic.  Mouth/Throat: Oropharynx is clear and moist.  Poor dentition, some missing teeth other decay, fragmentation  Frontal / maxillary sinuses non-tender. Nares patent without purulence or edema. Bilateral TMs clear without erythema, effusion or bulging (except R TM with mild scarring). Oropharynx mild dry mucus mem, clear without erythema, exudates, edema or asymmetry.  Eyes: Conjunctivae and EOM are normal. Pupils are equal, round, and reactive to light.  Neck: Normal range of motion. Neck supple. No thyromegaly present.  No carotid bruits  Cardiovascular: Normal rate, regular rhythm, normal heart sounds and intact distal pulses.   No murmur heard. Pulmonary/Chest: Effort normal and breath sounds normal. No respiratory distress. He has no wheezes. He has no rales.  Abdominal: Soft. He exhibits no distension.  Genitourinary:  Genitourinary Comments: Perineal region with slightly L of midline area of firm induration 2-3 cm area, no erythema, no open ulceration or drainage of pus or bleeding. Mild tender. No fluctuance. s/p drainage at home  Musculoskeletal: Normal range of motion. He exhibits no edema.  Lymphadenopathy:    He has no cervical adenopathy.  Neurological: He is alert and oriented to person, place, and time.  Distal sensation to light touch intact  Skin: Skin is warm and dry. Rash  (resolving mild erythematous hives some on face and trunk) noted. He is not diaphoretic. No erythema.  Bilateral axillary with old scars and hyperpigmentation, without palpable cysts or active LAD. Non tender.  Psychiatric: He has a normal mood and affect. His behavior is normal.  Well groomed, good eye contact, normal speech and thoughts, does not appear anxious  Nursing note and vitals reviewed.   I have personally reviewed the following lab results from 09/15/16.  Results for orders placed or performed in visit on 09/15/16  Comprehensive metabolic panel  Result Value Ref Range   Glucose 124 (H) 65 - 99 mg/dL   BUN 9 6 - 20 mg/dL   Creatinine, Ser 4.09 0.76 - 1.27 mg/dL   GFR calc non Af Amer 104 >59 mL/min/1.73   GFR calc Af Amer 120 >59 mL/min/1.73   BUN/Creatinine Ratio 10 9 - 20   Sodium 138 134 - 144 mmol/L   Potassium 4.5 3.5 - 5.2 mmol/L   Chloride 99 96 - 106 mmol/L   CO2 24 18 - 29 mmol/L   Calcium 9.7 8.7 - 10.2 mg/dL   Total Protein 7.6 6.0 - 8.5 g/dL   Albumin 4.3 3.5 - 5.5 g/dL   Globulin, Total 3.3 1.5 - 4.5 g/dL   Albumin/Globulin Ratio 1.3 1.2 - 2.2   Bilirubin Total 0.3 0.0 - 1.2 mg/dL   Alkaline Phosphatase 82 39 - 117 IU/L   AST 20 0 - 40 IU/L   ALT 40 0 - 44 IU/L  Lipid panel  Result Value Ref Range   Cholesterol, Total 187 100 - 199 mg/dL   Triglycerides 811 (H) 0 - 149 mg/dL   HDL 31 (L) >91 mg/dL   VLDL Cholesterol Cal 32 5 - 40 mg/dL   LDL Calculated 478 (H) 0 - 99 mg/dL   Chol/HDL Ratio 6.0 (H) 0.0 - 5.0 ratio  Hemoglobin A1c  Result Value Ref Range   Hgb A1c MFr Bld 7.4 (H) 4.8 - 5.6 %   Est. average glucose Bld gHb Est-mCnc 166 mg/dL  CBC with Differential/Platelet  Result Value Ref Range   WBC 9.1 3.4 - 10.8 x10E3/uL   RBC 5.18 4.14 - 5.80 x10E6/uL   Hemoglobin 15.0 13.0 - 17.7 g/dL   Hematocrit 29.5 62.1 - 51.0 %   MCV 83 79 - 97 fL   MCH 29.0 26.6 - 33.0 pg   MCHC 35.0 31.5 - 35.7 g/dL   RDW 30.8 65.7 - 84.6 %   Platelets 296 150 - 379  x10E3/uL   Neutrophils 49 Not Estab. %   Lymphs 37 Not Estab. %   Monocytes 10 Not Estab. %   Eos 3 Not Estab. %   Basos 1 Not Estab. %  Neutrophils Absolute 4.5 1.4 - 7.0 x10E3/uL   Lymphocytes Absolute 3.4 (H) 0.7 - 3.1 x10E3/uL   Monocytes Absolute 0.9 0.1 - 0.9 x10E3/uL   EOS (ABSOLUTE) 0.3 0.0 - 0.4 x10E3/uL   Basophils Absolute 0.1 0.0 - 0.2 x10E3/uL   Immature Granulocytes 0 Not Estab. %   Immature Grans (Abs) 0.0 0.0 - 0.1 x10E3/uL  HIV antibody  Result Value Ref Range   HIV Screen 4th Generation wRfx Non Reactive Non Reactive      Assessment & Plan:   Problem List Items Addressed This Visit    Tobacco abuse    Not ready to quit, but interested in near future, consider Wellbutrin      Hypertension    Mild to moderate improved BP on Amlodipine 5mg  daily now, near goal but still close to 140s/mid 80s - No known complication - Concern for his prior exertional symptoms, now seems controlled with modified activity - Allergy to ASA 81 (NSAIDs)  Plan: 1. Continue Amlodipine 5mg  daily for longer and get BP cuff to monitor BP at home first, if persistently >140/90, then can increase to 10mg  (x 2 pills) for 1-2 weeks, notify office if need stronger rx 2. Stop ASA due to allergy 3. Reduce energy drinks, caffeine, improve hydration 4. Improve regular exercise 5. Follow-up 3 months re-check HTN, in future recommend referral to Cardiology for further monitoring if still has any exertional symptoms or even if resolved may benefit from cardiac work-up and stress test etc      Hyperlipidemia    Abnormal lipids, elevated LDL and low HDL, obesity, likely related to abnormal glucose with likely DM - Had advised to take ASA 81mg  daily for primary cardiovascular risk reduction, but now allergy, so discussed statins and ASCVD risk reduction in diabetes as well, he agrees to start low dose statin if dx Diabetes with repeat A1c will send in likely low dose Rosuvastatin if that is case,  counseling on myalgias      Hidradenitis suppurativa    Chronic problem, now with one drained cyst in perineal groin, previously had axillary problem, s/p I&D in past - History of possible MRSA carrier - Treat current cyst with Doxycycline for empiric coverage - In future may need General Surgery      Relevant Medications   doxycycline (VIBRA-TABS) 100 MG tablet   Elevated hemoglobin A1c    Abnormal A1c 7.4, no comparison, concern new type 2 diabetes diagnosis, he was anticipating this, attributed to poor diet and lifestyle. Prior hyperglycemia results 120-160 within past year. - +polyuria, family history DM - No known complications  Plan: 1. Re-check A1c 1-2 weeks [facility], printed and given order to patient, to confirm A1c level - then if still >6.5 will make dx diabetes type 2 2. Discussed diagnosis, management, prognosis - offered Metformin trial vs lifestyle then future metformin, he prefers to take Metformin 500-1000mg  daily first if still elevated A1c, as he knows it will take him time to really improve lifestyle, then maybe can come off of Metformin in future 3. Reduce sugary drinks, energy drink, improve water, handout given and discussed low carb DM Diet 4. Interested in nutritionist referral in near future - hold today 5. Follow-up lab result 1-2 weeks, then next likely repeat A1c 3 months      Relevant Orders   Hemoglobin A1c   Anxiety associated with depression    No significant change yet in 1 week, had to adjust dose Fluoxetine due to side effect HA - Chronic  anxiety vs GAD with associated depression vs possible PTSD. - Triggers with life stressors, divorce, custody, military flashbacks, financial -GAD7: 11, somewhat difficult / PHQ9: 12 somewhat difficult - Prior med: Zoloft reported up to 200mg  last dose 2011 (ineffective after a while) - [place] psychiat[place] of state  Plan: 1. Continue Fluoxetine 20mg  - now taking half tab BID for better tolerance with HA.  Anticipate 4-6 weeks for notable effect, may need titrate dose to 20 in future 2. In future - again advised recommend therapy / counseling in future - if needed will consider Psychiatry referral 3. Follow-up if needed 4-6 weeks PHQ/GAD med adjust - consider add Wellbutrin for smoking cessation and mood       Other Visit Diagnoses    Annual physical exam    -  Primary   Cyst of perineum in male       No active abscess or cellulitis, concern with history MRSA and significant drainage in perineal region. Start empiric Doxy. Return criteria given.   Relevant Medications   doxycycline (VIBRA-TABS) 100 MG tablet      Meds ordered this encounter  Medications  . doxycycline (VIBRA-TABS) 100 MG tablet    Sig: Take 1 tablet (100 mg total) by mouth 2 (two) times daily. For 10 days. Take with full glass of water, stay upright 30 min after taking.    Dispense:  20 tablet    Refill:  0     Follow up plan: Return in about 3 months (around 12/22/2016) for Anxiety/Depression / Smoking OR HTN / DM A1c.  Follow-up 3 months, discussed that too many issues to do all in one appointment. He will likely need one visit for HTN / DM vs elevated A1c, HLD and possible referral to Cardiology. Also would need separate appointment for Anxiety/Depression/PTSD and Smoking cessation.  Saralyn Pilar, DO [facility] 09/22/2016, 6:57 AM

## 2016-09-22 DIAGNOSIS — E785 Hyperlipidemia, unspecified: Secondary | ICD-10-CM | POA: Insufficient documentation

## 2016-09-22 DIAGNOSIS — E1169 Type 2 diabetes mellitus with other specified complication: Secondary | ICD-10-CM | POA: Insufficient documentation

## 2016-09-22 NOTE — Assessment & Plan Note (Addendum)
Not ready to quit, but interested in near future, consider Wellbutrin

## 2016-09-22 NOTE — Assessment & Plan Note (Addendum)
Abnormal A1c 7.4, no comparison, concern new type 2 diabetes diagnosis, he was anticipating this, attributed to poor diet and lifestyle. Prior hyperglycemia results 120-160 within past year. - +polyuria, family history DM - No known complications  Plan: 1. Re-check A1c 1-2 weeks [facility], printed and given order to patient, to confirm A1c level - then if still >6.5 will make dx diabetes type 2 2. Discussed diagnosis, management, prognosis - offered Metformin trial vs lifestyle then future metformin, he prefers to take Metformin 500-1000mg  daily first if still elevated A1c, as he knows it will take him time to really improve lifestyle, then maybe can come off of Metformin in future 3. Reduce sugary drinks, energy drink, improve water, handout given and discussed low carb DM Diet 4. Interested in nutritionist referral in near future - hold today 5. Follow-up lab result 1-2 weeks, then next likely repeat A1c 3 months

## 2016-09-22 NOTE — Assessment & Plan Note (Signed)
Mild to moderate improved BP on Amlodipine 5mg  daily now, near goal but still close to 140s/mid 80s - No known complication - Concern for his prior exertional symptoms, now seems controlled with modified activity - Allergy to ASA 81 (NSAIDs)  Plan: 1. Continue Amlodipine 5mg  daily for longer and get BP cuff to monitor BP at home first, if persistently >140/90, then can increase to 10mg  (x 2 pills) for 1-2 weeks, notify office if need stronger rx 2. Stop ASA due to allergy 3. Reduce energy drinks, caffeine, improve hydration 4. Improve regular exercise 5. Follow-up 3 months re-check HTN, in future recommend referral to Cardiology for further monitoring if still has any exertional symptoms or even if resolved may benefit from cardiac work-up and stress test etc

## 2016-09-22 NOTE — Assessment & Plan Note (Signed)
Chronic problem, now with one drained cyst in perineal groin, previously had axillary problem, s/p I&D in past - History of possible MRSA carrier - Treat current cyst with Doxycycline for empiric coverage - In future may need General Surgery

## 2016-09-22 NOTE — Assessment & Plan Note (Addendum)
No significant change yet in 1 week, had to adjust dose Fluoxetine due to side effect HA - Chronic anxiety vs GAD with associated depression vs possible PTSD. - Triggers with life stressors, divorce, custody, military flashbacks, financial -GAD7: 11, somewhat difficult / PHQ9: 12 somewhat difficult - Prior med: Zoloft reported up to 200mg  last dose 2011 (ineffective after a while) - [place] psychiat[place] of state  Plan: 1. Continue Fluoxetine 20mg  - now taking half tab BID for better tolerance with HA. Anticipate 4-6 weeks for notable effect, may need titrate dose to 20 in future 2. In future - again advised recommend therapy / counseling in future - if needed will consider Psychiatry referral 3. Follow-up if needed 4-6 weeks PHQ/GAD med adjust - consider add Wellbutrin for smoking cessation and mood

## 2016-09-22 NOTE — Assessment & Plan Note (Signed)
Abnormal lipids, elevated LDL and low HDL, obesity, likely related to abnormal glucose with likely DM - Had advised to take ASA 81mg  daily for primary cardiovascular risk reduction, but now allergy, so discussed statins and ASCVD risk reduction in diabetes as well, he agrees to start low dose statin if dx Diabetes with repeat A1c will send in likely low dose Rosuvastatin if that is case, counseling on myalgias

## 2016-12-11 ENCOUNTER — Encounter: Payer: Self-pay | Admitting: Family Medicine

## 2016-12-13 ENCOUNTER — Other Ambulatory Visit: Payer: Self-pay

## 2016-12-13 DIAGNOSIS — F418 Other specified anxiety disorders: Secondary | ICD-10-CM

## 2016-12-13 DIAGNOSIS — I10 Essential (primary) hypertension: Secondary | ICD-10-CM

## 2016-12-13 MED ORDER — FLUOXETINE HCL 20 MG PO TABS: 10.0000 mg | ORAL_TABLET | Freq: Two times a day (BID) | ORAL | 5 refills | Status: DC

## 2016-12-13 MED ORDER — AMLODIPINE BESYLATE 5 MG PO TABS: 5.0000 mg | ORAL_TABLET | Freq: Every day | ORAL | 5 refills | Status: DC

## 2016-12-13 NOTE — Telephone Encounter (Signed)
The pt sent a mychart message :Need a refill on the Fluoxetine 20mg  and amlodipine besylate 5mg  Both have been working well

## 2017-01-15 ENCOUNTER — Other Ambulatory Visit: Payer: Self-pay | Admitting: Family Medicine

## 2017-01-15 DIAGNOSIS — N5089 Other specified disorders of the male genital organs: Secondary | ICD-10-CM

## 2017-01-15 DIAGNOSIS — L732 Hidradenitis suppurativa: Secondary | ICD-10-CM

## 2017-01-27 IMAGING — CT CT PELVIS W/ CM
2 of 6 series · 15 of 46 positions shown, 19 images · IV contrast (APPLIED)
Comparison: None.

CLINICAL DATA: Boil to perineal area x 5 days. Pt denies pelvic
surgery.

EXAM:
CT PELVIS WITH CONTRAST
TECHNIQUE: Multidetector CT imaging of the pelvis was performed using the
standard protocol following the bolus administration of intravenous
contrast.
CONTRAST:  100mL I5TNAN-0SS IOPAMIDOL (I5TNAN-0SS) INJECTION 61%

[Series 2: axial st · axial · 0.89mm/px · z∈[-639,-334]mm · 12 of 72 slices shown, 16 images]
[im 7/72  soft-tissue]
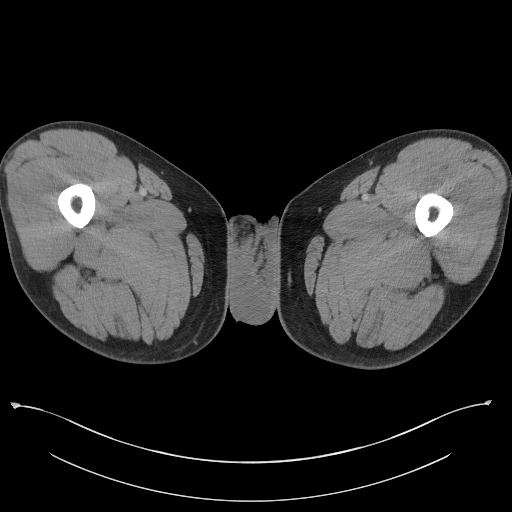
[im 7/72  bone]
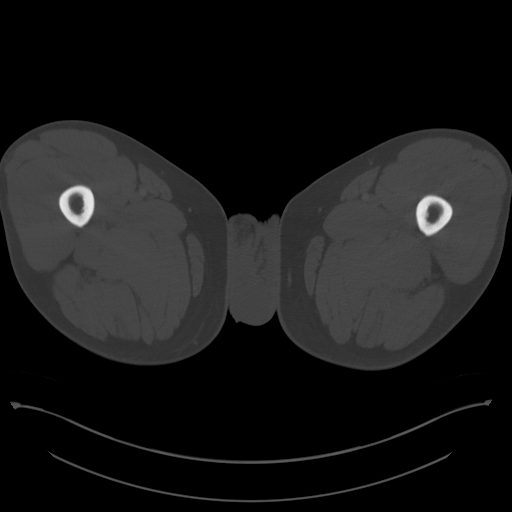
[im 13/72  soft-tissue]
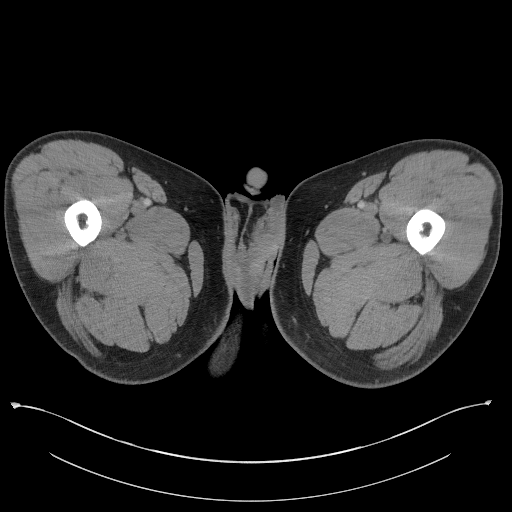
[im 20/72  soft-tissue]
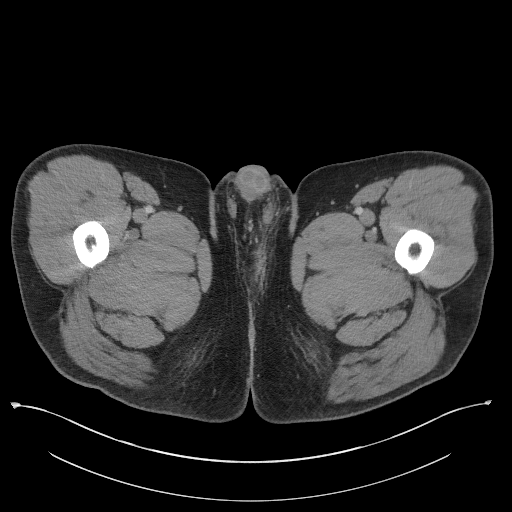
[im 26/72  soft-tissue]
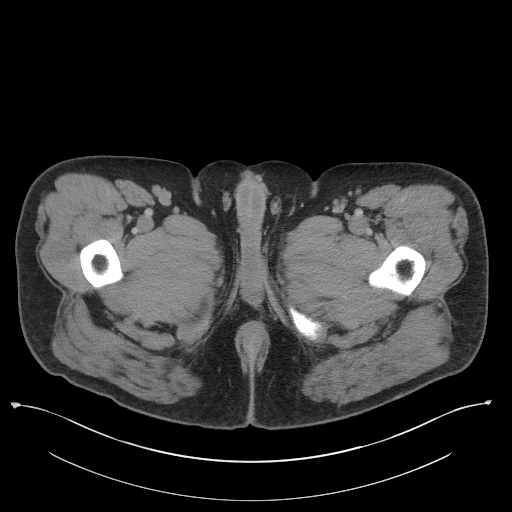
[im 33/72  soft-tissue]
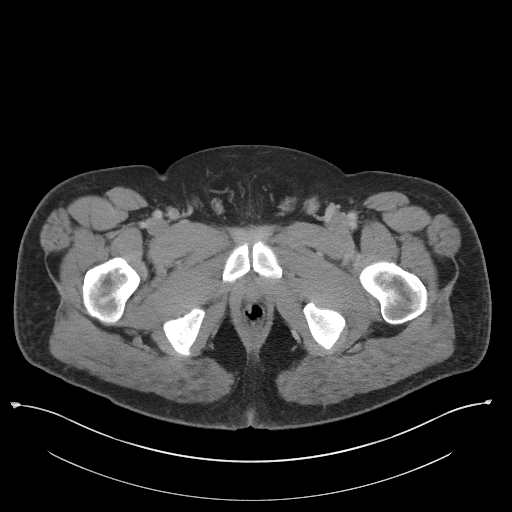
[im 39/72  soft-tissue]
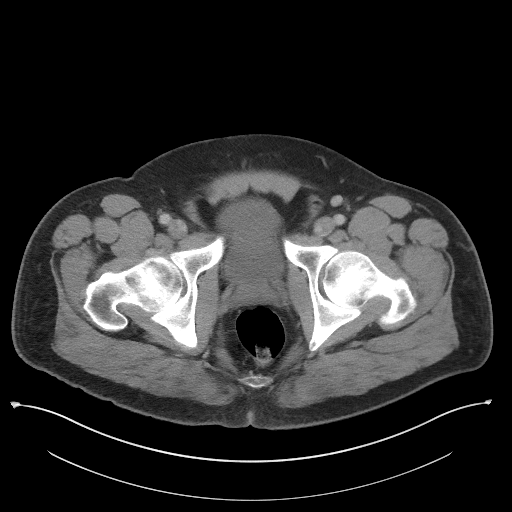
[im 46/72  soft-tissue]
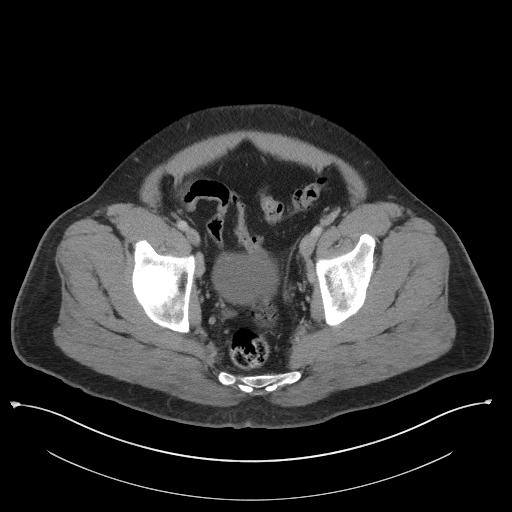
[im 52/72  soft-tissue]
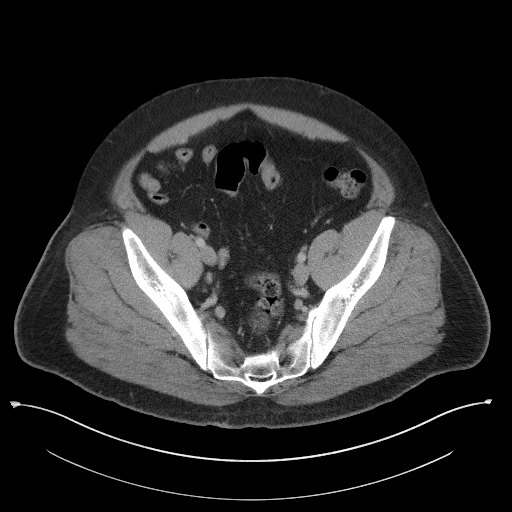
[im 59/72  soft-tissue]
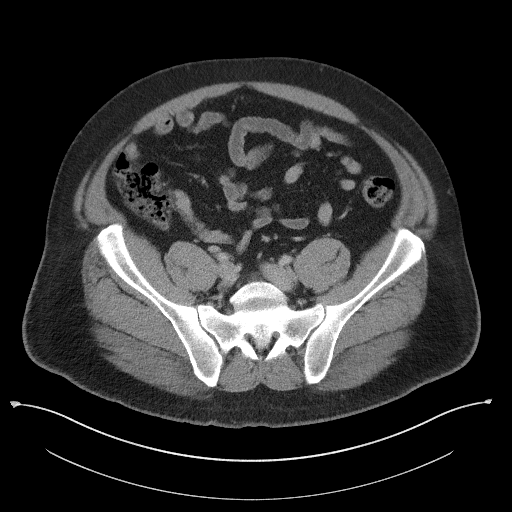
[im 59/72  lung]
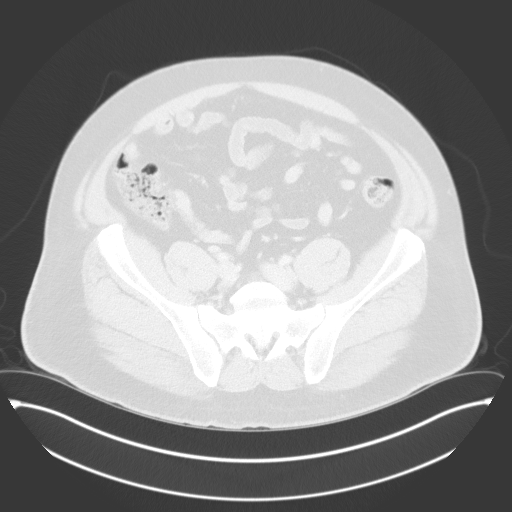
[im 59/72  bone]
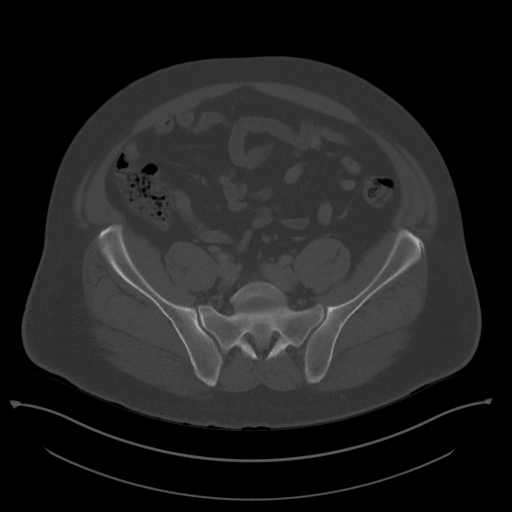
[im 62/72  lung]
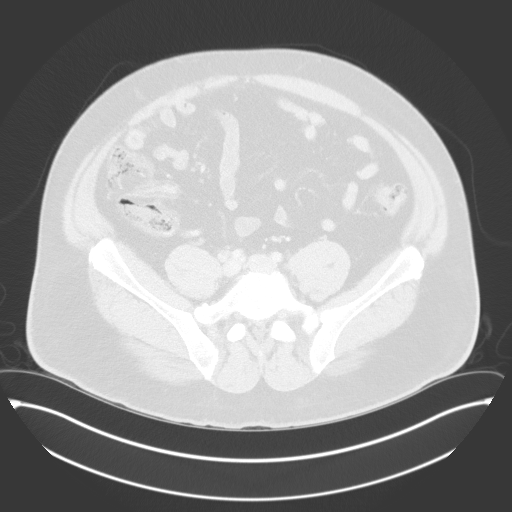
[im 65/72  soft-tissue]
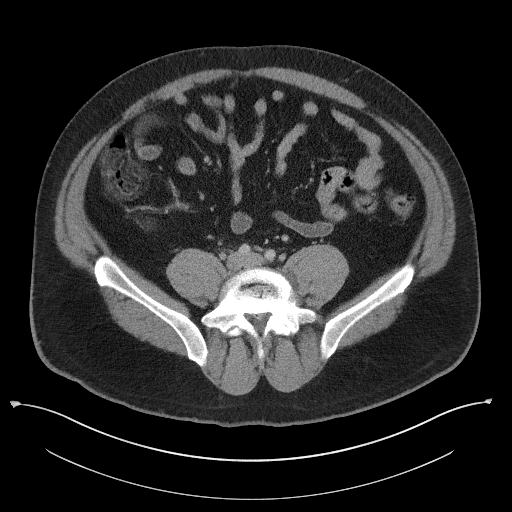
[im 65/72  lung]
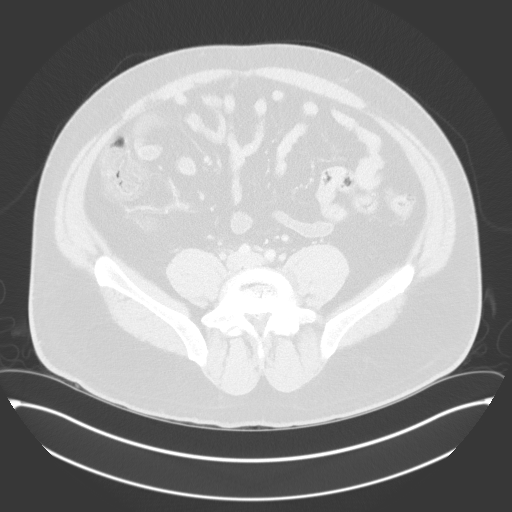
[im 68/72  lung]
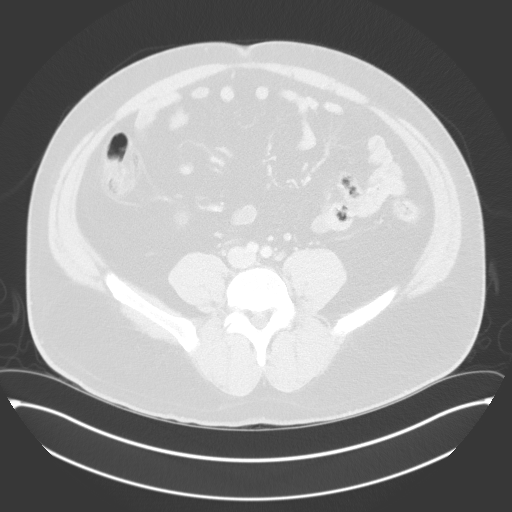

[Series 6: coronal st · coronal · 0.72mm/px · 3 of 102 slices shown]
[im 21/102  soft-tissue]
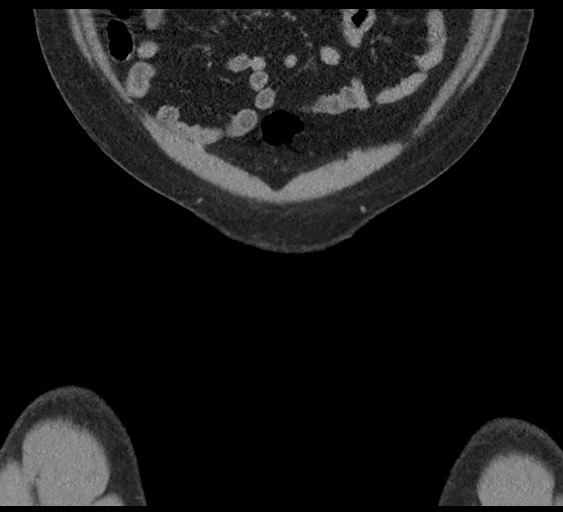
[im 41/102  soft-tissue]
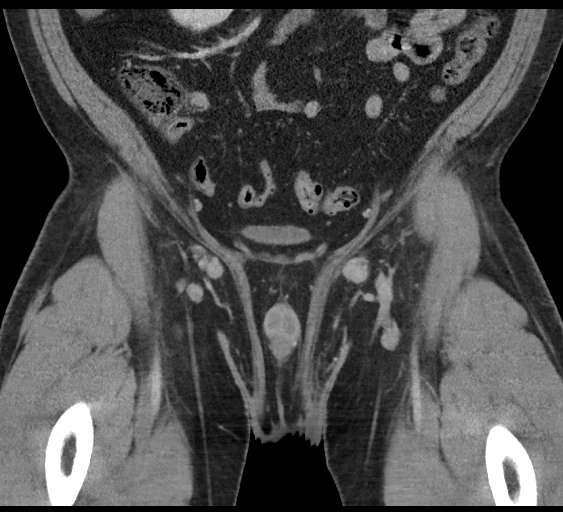
[im 61/102  soft-tissue]
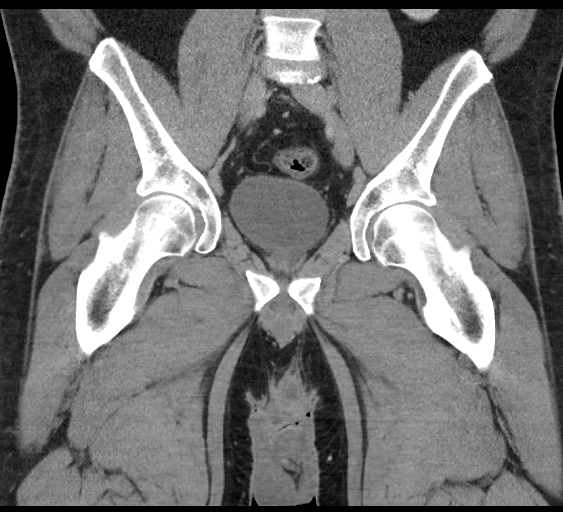

[15 of 46 positions shown; findings below may reference images not displayed]

FINDINGS: There is a fluid collection with an irregular enhancing rim within
the subcutaneous soft tissues of the left perineum contacting the
posterior upper left scrotum. This measures 5.8 x 1.7 x 3.0 cm.
There is surrounding inflammatory stranding.

There is no abnormal attenuation or fluid collection above the
levator sling.

Prominent inguinal lymph nodes, largest on the left measuring 13 mm
in short axis.

Within the pelvis, there are no masses or enlarged lymph nodes.
There are no abnormal fluid collections. The bladder is
unremarkable. The visualized bowel is unremarkable. A normal
appendix is visualized.

Vascular structures are unremarkable.

No significant skeletal abnormality.
IMPRESSION: 1. 5.8 x 1.7 x 3.0 cm superficial collection along the left perineum
bordering the left posterior superior scrotum. This is consistent
with an infected/inflamed sebaceous cyst or inclusion cyst.
2. Mildly prominent inguinal lymph nodes that appear reactive.
3. Exam otherwise unremarkable.

## 2017-04-11 ENCOUNTER — Encounter: Payer: Self-pay | Admitting: Nurse Practitioner

## 2017-04-11 ENCOUNTER — Ambulatory Visit (INDEPENDENT_AMBULATORY_CARE_PROVIDER_SITE_OTHER): Payer: 59 | Admitting: Nurse Practitioner

## 2017-04-11 ENCOUNTER — Other Ambulatory Visit: Payer: Self-pay

## 2017-04-11 VITALS — BP 125/75 | HR 81 | Temp 98.1°F | Resp 18 | Ht 71.0 in | Wt 266.8 lb

## 2017-04-11 DIAGNOSIS — R1013 Epigastric pain: Secondary | ICD-10-CM | POA: Diagnosis not present

## 2017-04-11 DIAGNOSIS — J069 Acute upper respiratory infection, unspecified: Secondary | ICD-10-CM | POA: Diagnosis not present

## 2017-04-11 DIAGNOSIS — R1011 Right upper quadrant pain: Secondary | ICD-10-CM

## 2017-04-11 MED ORDER — OMEPRAZOLE 20 MG PO CPDR: 20.0000 mg | DELAYED_RELEASE_CAPSULE | Freq: Every day | ORAL | 3 refills | Status: DC

## 2017-04-11 MED ORDER — SUCRALFATE 1 G PO TABS: 1.0000 g | ORAL_TABLET | Freq: Three times a day (TID) | ORAL | 2 refills | Status: DC

## 2017-04-11 MED ORDER — FLUTICASONE PROPIONATE 50 MCG/ACT NA SUSP: 2.0000 | Freq: Every day | NASAL | 6 refills | Status: DC

## 2017-04-11 NOTE — Progress Notes (Signed)
Subjective:    Patient ID: Curtis Parrish, male    DOB: 03/06/1981, 36 y.o.   MRN: [id number]  Curtis Parrish is a 36 y.o. male presenting on 04/11/2017 for URI (x 1 week, coughing) and Bloated (mid abdominal pain mostly w/ coughing. )   HPI URI Starting symptoms of URI about 3 days ago, started w/ cough, some minimal congestion and nasal stuffiness.   Does have some chills, subjective fever.  Started about 2 years ago. No changes since cough started.  Abdominal Pain When he coughs has abdominal pain sharp to dull and stops.  Occasionally happens immediately after eating or drinking any beverage and lasts about 5 mintues.  Sensation of feeling it "hit the bottom of the stomach."  No alcohol consumption.  Has heartburn about 1x per month. - Constipation and diarrhea.  No change with increased fatty foods.  Notes lots of stress at home right now.  lasted 8-9 months w/ worsening over last 1 month  Social History   Tobacco Use  . Smoking status: Current Every Day Smoker    Packs/day: 1.00    Years: 30.00    Pack years: 30.00    Types: Cigarettes  . Smokeless tobacco: Current User  Substance Use Topics  . Alcohol use: No    Comment: Abstaining from alcohol for 15 years, prior history in age 89s  . Drug use: No    Review of Systems Per HPI unless specifically indicated above     Objective:    BP 125/75 (BP Location: Right Arm, Patient Position: Sitting, Cuff Size: Normal)   Pulse 81   Temp 98.1 F (36.7 C) (Oral)   Resp 18   Ht 5\' 11"  (1.803 m)   Wt 266 lb 12.8 oz (121 kg)   SpO2 97%   BMI 37.21 kg/m   Wt Readings from Last 3 Encounters:  04/11/17 266 lb 12.8 oz (121 kg)  09/21/16 252 lb (114.3 kg)  09/15/16 250 lb (113.4 kg)    Physical Exam  Constitutional: He is oriented to person, place, and time. He appears well-developed and well-nourished. No distress.  obese  HENT:  Head: Normocephalic and atraumatic.  Right Ear: Hearing, tympanic membrane, external ear  and ear canal normal.  Left Ear: Hearing, tympanic membrane, external ear and ear canal normal.  Nose: Mucosal edema and rhinorrhea present. Right sinus exhibits no maxillary sinus tenderness and no frontal sinus tenderness. Left sinus exhibits no maxillary sinus tenderness and no frontal sinus tenderness.  Mouth/Throat: Uvula is midline, oropharynx is clear and moist and mucous membranes are normal.  Neck: Normal range of motion. Neck supple. No JVD present. No tracheal deviation present. No thyromegaly present.  Cardiovascular: Normal rate, regular rhythm, normal heart sounds and intact distal pulses.  Pulmonary/Chest: Effort normal and breath sounds normal. No respiratory distress.  Abdominal: Soft. Bowel sounds are normal. There is no hepatosplenomegaly. There is tenderness in the right upper quadrant and epigastric area. There is positive Murphy's sign. There is no rigidity, no rebound, no guarding and no CVA tenderness. Hernia confirmed negative in the ventral area.  Rounded abdomen 2/2 obesity.  Lymphadenopathy:    He has no cervical adenopathy.  Neurological: He is alert and oriented to person, place, and time.  Skin: Skin is warm and dry.  Psychiatric: He has a normal mood and affect. His behavior is normal. Judgment and thought content normal.  Vitals reviewed.    Results for orders placed or performed in visit on 09/15/16  Comprehensive metabolic panel  Result Value Ref Range   Glucose 124 (H) 65 - 99 mg/dL   BUN 9 6 - 20 mg/dL   Creatinine, Ser 1.61 0.76 - 1.27 mg/dL   GFR calc non Af Amer 104 >59 mL/min/1.73   GFR calc Af Amer 120 >59 mL/min/1.73   BUN/Creatinine Ratio 10 9 - 20   Sodium 138 134 - 144 mmol/L   Potassium 4.5 3.5 - 5.2 mmol/L   Chloride 99 96 - 106 mmol/L   CO2 24 18 - 29 mmol/L   Calcium 9.7 8.7 - 10.2 mg/dL   Total Protein 7.6 6.0 - 8.5 g/dL   Albumin 4.3 3.5 - 5.5 g/dL   Globulin, Total 3.3 1.5 - 4.5 g/dL   Albumin/Globulin Ratio 1.3 1.2 - 2.2    Bilirubin Total 0.3 0.0 - 1.2 mg/dL   Alkaline Phosphatase 82 39 - 117 IU/L   AST 20 0 - 40 IU/L   ALT 40 0 - 44 IU/L  Lipid panel  Result Value Ref Range   Cholesterol, Total 187 100 - 199 mg/dL   Triglycerides 096 (H) 0 - 149 mg/dL   HDL 31 (L) >04 mg/dL   VLDL Cholesterol Cal 32 5 - 40 mg/dL   LDL Calculated 540 (H) 0 - 99 mg/dL   Chol/HDL Ratio 6.0 (H) 0.0 - 5.0 ratio  Hemoglobin A1c  Result Value Ref Range   Hgb A1c MFr Bld 7.4 (H) 4.8 - 5.6 %   Est. average glucose Bld gHb Est-mCnc 166 mg/dL  CBC with Differential/Platelet  Result Value Ref Range   WBC 9.1 3.4 - 10.8 x10E3/uL   RBC 5.18 4.14 - 5.80 x10E6/uL   Hemoglobin 15.0 13.0 - 17.7 g/dL   Hematocrit 98.1 19.1 - 51.0 %   MCV 83 79 - 97 fL   MCH 29.0 26.6 - 33.0 pg   MCHC 35.0 31.5 - 35.7 g/dL   RDW 47.8 29.5 - 62.1 %   Platelets 296 150 - 379 x10E3/uL   Neutrophils 49 Not Estab. %   Lymphs 37 Not Estab. %   Monocytes 10 Not Estab. %   Eos 3 Not Estab. %   Basos 1 Not Estab. %   Neutrophils Absolute 4.5 1.4 - 7.0 x10E3/uL   Lymphocytes Absolute 3.4 (H) 0.7 - 3.1 x10E3/uL   Monocytes Absolute 0.9 0.1 - 0.9 x10E3/uL   EOS (ABSOLUTE) 0.3 0.0 - 0.4 x10E3/uL   Basophils Absolute 0.1 0.0 - 0.2 x10E3/uL   Immature Granulocytes 0 Not Estab. %   Immature Grans (Abs) 0.0 0.0 - 0.1 x10E3/uL  HIV antibody  Result Value Ref Range   HIV Screen 4th Generation wRfx Non Reactive Non Reactive      Assessment & Plan:   Problem List Items Addressed This Visit    None    Visit Diagnoses    Right upper quadrant abdominal pain    -  Primary Possible cholecystitis, but also has alternative diagnosis of PUD.  Plan: 1. Proceed w/ [place] RUQ abdomen since pt prefers to test now rather than wait. 2. Followup after US obtained as needed.   Relevant Orders   US Abdomen Limited RUQ   Epigastric abdominal pain     Pt w/ epigastric abdominal pain worst immediately after eating that is consistent with possible PUD.  Pt also has RUQ  abdominal pain that may indicate possible, but less likely cholecystitis. Plan 1. START omeprazole 20 mg once daily for up to 3 months promote gastric healing.  2. START sucralfate prior to meals and at bedtime for symptoms. 3. Followup in 7-14 days as needed.   Relevant Medications   omeprazole (PRILOSEC) 20 MG capsule   sucralfate (CARAFATE) 1 g tablet   Viral URI     Acute illness. Fever responsive to NSAIDs and tylenol.  Symptoms not worsening. Consistent with viral illness x 3 days with known sick contacts and no identifiable focal infections of ears, nose, throat.  Plan: 1. Reassurance, likely self-limited with cough lasting up to few weeks - also can use Flonase 2 sprays each nostril daily for up to 4-6 weeks - Start Mucinex-DM OTC up to 7-10 days then stop 2. Supportive care with nasal saline, warm herbal tea with honey, 3. Improve hydration 4. Tylenol / Motrin PRN fevers 5. Return criteria given    Relevant Medications   fluticasone (FLONASE) 50 MCG/ACT nasal spray      Meds ordered this encounter  Medications  . omeprazole (PRILOSEC) 20 MG capsule    Sig: Take 1 capsule (20 mg total) by mouth daily.    Dispense:  30 capsule    Refill:  3    Order Specific Question:   Supervising Provider    Answer:   Smitty CordsKARAMALEGOS, ALEXANDER J [[id number]]  . sucralfate (CARAFATE) 1 g tablet    Sig: Take 1 tablet (1 g total) by mouth 4 (four) times daily -  with meals and at bedtime.    Dispense:  120 tablet    Refill:  2    Order Specific Question:   Supervising Provider    Answer:   Smitty CordsKARAMALEGOS, ALEXANDER J [[id number]]  . fluticasone (FLONASE) 50 MCG/ACT nasal spray    Sig: Place 2 sprays into both nostrils daily.    Dispense:  16 g    Refill:  6    Order Specific Question:   Supervising Provider    Answer:   Smitty CordsKARAMALEGOS, ALEXANDER J [[id number]]    Follow up plan: Return 7 days if symptoms worsen or fail to improve.  Curtis McardleLauren Shamarra Warda, DNP, AGPCNP-BC Adult Gerontology Primary Care Nurse  Practitioner [facility] 04/11/2017, 9:35 AM

## 2017-04-11 NOTE — Patient Instructions (Addendum)
Curtis Parrish, Thank you for coming in to clinic today.  1.  Upper Respiratory Virus: - If you have symptoms more than 10 days total, we can consider antibiotics. - also can use Flonase 2 sprays each nostril daily for up to 4-6 weeks - If congestion is worse, start OTC Mucinex (or may try Mucinex-DM for cough) up to 7-10 days then stop - Drink plenty of fluids to improve congestion - You may try over the counter Nasal Saline spray (Simply Saline, Ocean Spray) as needed to reduce congestion. - Drink warm herbal tea with honey for sore throat. - Start taking Tylenol extra strength 1 to 2 tablets every 6-8 hours for aches or fever/chills for next few days as needed.  Do not take more than 3,000 mg in 24 hours from all medicines.  May take Ibuprofen as well if tolerated 200-400mg  every 8 hours as needed.  If symptoms significantly worsening with persistent fevers/chills despite tylenol/ibpurofen, nausea, vomiting unable to tolerate food/fluids or medicine, body aches, or shortness of breath, sinus pain pressure or worsening productive cough, then follow-up for re-evaluation, may seek more immediate care at Urgent Care or ED if more concerned for emergency.  2. For your abdominal pain: - We are considering several possible diagnoses.  We are evaluating your gall bladder with an ultrasound.  It is also possible you have a gastric ulcer.   - START taking omeprazole (Prilosec) 20 mg once daily - START sucralfate tablet 5-10 minutes before meals and at bedtime.  Please schedule a follow-up appointment with Wilhelmina McardleLauren Breyona Swander, AGNP. Return 7 days if symptoms worsen or fail to improve.  If you have any other questions or concerns, please feel free to call the clinic or send a message through MyChart. You may also schedule an earlier appointment if necessary.  You will receive a survey after today's visit either digitally by e-mail or paper by [employer] mail. Your experiences and feedback matter to us.  Please respond so  we know how we are doing as we provide care for you.   Wilhelmina McardleLauren Allysen Lazo, DNP, AGNP-BC Adult Gerontology Nurse Practitioner [facility], [facility]

## 2017-04-18 ENCOUNTER — Ambulatory Visit
Admission: RE | Admit: 2017-04-18 | Discharge: 2017-04-18 | Disposition: A | Discharge status: Home / Self Care | Payer: 59 | Source: Ambulatory Visit | Attending: Nurse Practitioner | Admitting: Nurse Practitioner

## 2017-04-18 DIAGNOSIS — K760 Fatty (change of) liver, not elsewhere classified: Secondary | ICD-10-CM | POA: Diagnosis not present

## 2017-04-18 DIAGNOSIS — R1011 Right upper quadrant pain: Secondary | ICD-10-CM

## 2017-04-21 ENCOUNTER — Other Ambulatory Visit: Payer: Self-pay

## 2017-04-21 ENCOUNTER — Encounter: Payer: Self-pay | Admitting: Nurse Practitioner

## 2017-04-21 ENCOUNTER — Ambulatory Visit (INDEPENDENT_AMBULATORY_CARE_PROVIDER_SITE_OTHER): Payer: 59 | Admitting: Nurse Practitioner

## 2017-04-21 VITALS — BP 140/79 | HR 90 | Temp 97.9°F | Resp 18 | Ht 70.0 in | Wt 270.6 lb

## 2017-04-21 DIAGNOSIS — F418 Other specified anxiety disorders: Secondary | ICD-10-CM

## 2017-04-21 DIAGNOSIS — J189 Pneumonia, unspecified organism: Secondary | ICD-10-CM | POA: Diagnosis not present

## 2017-04-21 DIAGNOSIS — R1011 Right upper quadrant pain: Secondary | ICD-10-CM

## 2017-04-21 MED ORDER — DOXYCYCLINE HYCLATE 100 MG PO TABS: 100.0000 mg | ORAL_TABLET | Freq: Two times a day (BID) | ORAL | 0 refills | Status: DC

## 2017-04-21 MED ORDER — BUPROPION HCL ER (XL) 150 MG PO TB24: 150.0000 mg | ORAL_TABLET | Freq: Every day | ORAL | 5 refills | Status: DC

## 2017-04-21 NOTE — Patient Instructions (Addendum)
Kayle, Thank you for coming in to clinic today.  1. I have ordered a HIDA scan.  This further evaluates your gall bladder for possible cause of your abdominal pain. - They will call to schedule this scan.   2. For your cough: - This is likely community acquired pneumonia. - START taking doxycycline 100 mg twice daily for 10 days. - START Mucinex or guaifenesin twice daily for congestion.  USE Mucinex DM for cough and congestion.   3. Your fluoxetine is a possible cause for your abdominal pain as well. - START bupropion XL 150 mg tablet once daily. - Take one full tablet fluoxetine for 7 more days. Then, take 1/2 tablet once daily for 7 days.  Then, take 1/2 tablet every other day for 3 additional doses. Then, STOP. - Call if you are having any new side effects with this taper.   Please schedule a follow-up appointment. Return in about 4 weeks (around 05/19/2017) for anxiety and depression with Dr. Kirtland BouchardK.  If you have any other questions or concerns, please feel free to call the clinic or send a message through MyChart. You may also schedule an earlier appointment if necessary.  You will receive a survey after today's visit either digitally by e-mail or paper by [employer] mail. Your experiences and feedback matter to us.  Please respond so we know how we are doing as we provide care for you.   Wilhelmina McardleLauren Anaka Beazer, DNP, AGNP-BC Adult Gerontology Nurse Practitioner [facility], [facility]   Gallbladder Nuclear Scan A gallbladder nuclear scan (hepatobiliary scan or HIDA scan) is an imaging test that checks the function of your liver, your gallbladder, and the ducts of those organs. These parts make up your hepatobiliary system. You may need this scan if you have symptoms of liver or gallbladder disease. This scan is done with a camera that detects radioactive energy (gamma rays). For this exam, you will be given a radioactive substance, called a radiotracer, through an IV tube inserted in  your hand or arm. As the radiotracer moves through your hepatobiliary system, the camera and a computer detect the gamma rays and form them into images that show how well your system is working. Tell a health care provider about:  Any possibility of pregnancy or if you are breastfeeding.  Any allergies you have.  All medicines you are taking, including vitamins, herbs, eye drops, creams, and over-the-counter medicines.  Any problems you or family members have had with anesthetic medicines.  Any blood disorders you have.  Any surgeries you have had.  Any medical conditions you have. What happens before the procedure?  Take medicines only as directed by your health care provider.  Your health care provider will let you know when you should start fasting. You may not be able to eat or drink anything after midnight on the night before the procedure or for at least four hours before the test.  Do not wear jewelry to the exam.  Wear loose, comfortable clothing. You may be asked to put on a gown for the procedure. What happens during the procedure?  An IV tube will be inserted into a vein in your hand or arm. It will remain in place throughout the exam.  A small amount of the radiotracer material will be injected through your IV tube.  You may feel a cold sensation as the material runs through your IV tube.  While you are lying down, a technician will place the gamma camera over your  abdomen.  The camera may rotate around your body. You may be asked to stay still or move into a certain position.  You will then be given a medicine called cholecystokinin (CCK) through your IV tube. This will make your gallbladder empty. You may feel nauseous or have cramping for a short time.  Additional images will be taken after CCK is given.  After all the images have been taken, your IV tube will be removed. What happens after the procedure?  You may resume normal activities and diet as  directed by your health care provider.  The radiotracer will leave your body over the next few days. There are no long-term side effects from the radiotracer. Drink lots of fluids to help flush it out of your body.  A health care provider who specializes in nuclear medicine will read the scan and send a report to your regular health care provider. This information is not intended to replace advice given to you by your health care provider. Make sure you discuss any questions you have with your health care provider. Document Released: 04/30/2000 Document Revised: 10/09/2015 Document Reviewed: 09/05/2013 Elsevier Interactive Patient Education  [employer]2018 Elsevier Inc.

## 2017-04-21 NOTE — Progress Notes (Signed)
Subjective:    Patient ID: Curtis Parrish, male    DOB: 04-08-1981, 36 y.o.   MRN: [id number]  Curtis Paganinilijah C Rodriquez is a 36 y.o. male presenting on 04/21/2017 for Cough (productive cough w/ phlegm x 3 weeks )   HPI Cough Pt reports productive cough w/ productive phlegm x 3 weeks. - Has felt hot and cold w/ subjective fever. - More cough late at night.  Pt notes it takes several coughs, but does have production of thick yellow/green phlegm.   - Pt also endorses headache described as aching and lasting several hours. States it "rotates between sinus headaches and" pain in central skull from day to day.  Abdominal pain Improving but not resolved.  Pt still endorses generalized abdominal pain w/ occasional cramping of lateral abdominal muscles.  Symptoms have an inconsistent and unpredictable pattern, but are sometimes worse after a meal.   - Feels bloated today, but has not eaten. - Pt states it hurts to stand and move.    - No notice of increased GI motility.  Pt does endorse varying degrees of constipation and diarrhea, last night very hard BM.  Had no difficulty with any of these GI symptoms until starting medications for depression.  Is currently taking fluoxetine. - Pt does admit he is still very stressed and anxious.  Social History   Tobacco Use  . Smoking status: Current Every Day Smoker    Packs/day: 0.50    Years: 30.00    Pack years: 15.00    Types: Cigarettes  . Smokeless tobacco: Current User  Substance Use Topics  . Alcohol use: No    Comment: Abstaining from alcohol for 15 years, prior history in age 4020s  . Drug use: No    Review of Systems Per HPI unless specifically indicated above     Objective:    BP 140/79 (BP Location: Right Arm, Patient Position: Sitting, Cuff Size: Normal)   Pulse 90   Temp 97.9 F (36.6 C) (Oral)   Resp 18   Ht 5\' 10"  (1.778 m)   Wt 270 lb 9.6 oz (122.7 kg)   BMI 38.83 kg/m   Wt Readings from Last 3 Encounters:  04/21/17 270 lb 9.6 oz  (122.7 kg)  04/11/17 266 lb 12.8 oz (121 kg)  09/21/16 252 lb (114.3 kg)    Physical Exam  Constitutional: He is oriented to person, place, and time. He appears well-developed and well-nourished. No distress.  obese  HENT:  Head: Normocephalic and atraumatic.  Right Ear: Hearing, tympanic membrane, external ear and ear canal normal.  Left Ear: Hearing, tympanic membrane, external ear and ear canal normal.  Nose: Mucosal edema and rhinorrhea present. Right sinus exhibits no maxillary sinus tenderness and no frontal sinus tenderness. Left sinus exhibits no maxillary sinus tenderness and no frontal sinus tenderness.  Mouth/Throat: Uvula is midline, oropharynx is clear and moist and mucous membranes are normal.  Neck: Normal range of motion. Neck supple. No JVD present. No tracheal deviation present. No thyromegaly present.  Cardiovascular: Normal rate, regular rhythm, normal heart sounds and intact distal pulses.  Pulmonary/Chest: Effort normal. No respiratory distress. He has wheezes (throughout all lobes). He has rhonchi (throughout all lobes).  Abdominal: Soft. Bowel sounds are normal. There is no hepatosplenomegaly. There is tenderness in the right upper quadrant and epigastric area. There is no rigidity, no rebound, no guarding, no CVA tenderness and negative Murphy's sign. Hernia confirmed negative in the ventral area.  Rounded abdomen 2/2 obesity.  Lymphadenopathy:    He has no cervical adenopathy.  Neurological: He is alert and oriented to person, place, and time.  Skin: Skin is warm and dry.  Psychiatric: He has a normal mood and affect. His behavior is normal. Judgment and thought content normal.  Vitals reviewed.  Results for orders placed or performed in visit on 09/15/16  Comprehensive metabolic panel  Result Value Ref Range   Glucose 124 (H) 65 - 99 mg/dL   BUN 9 6 - 20 mg/dL   Creatinine, Ser 8.290.93 0.76 - 1.27 mg/dL   GFR calc non Af Amer 104 >59 mL/min/1.73   GFR calc Af  Amer 120 >59 mL/min/1.73   BUN/Creatinine Ratio 10 9 - 20   Sodium 138 134 - 144 mmol/L   Potassium 4.5 3.5 - 5.2 mmol/L   Chloride 99 96 - 106 mmol/L   CO2 24 18 - 29 mmol/L   Calcium 9.7 8.7 - 10.2 mg/dL   Total Protein 7.6 6.0 - 8.5 g/dL   Albumin 4.3 3.5 - 5.5 g/dL   Globulin, Total 3.3 1.5 - 4.5 g/dL   Albumin/Globulin Ratio 1.3 1.2 - 2.2   Bilirubin Total 0.3 0.0 - 1.2 mg/dL   Alkaline Phosphatase 82 39 - 117 IU/L   AST 20 0 - 40 IU/L   ALT 40 0 - 44 IU/L  Lipid panel  Result Value Ref Range   Cholesterol, Total 187 100 - 199 mg/dL   Triglycerides 562161 (H) 0 - 149 mg/dL   HDL 31 (L) >13>39 mg/dL   VLDL Cholesterol Cal 32 5 - 40 mg/dL   LDL Calculated 086124 (H) 0 - 99 mg/dL   Chol/HDL Ratio 6.0 (H) 0.0 - 5.0 ratio  Hemoglobin A1c  Result Value Ref Range   Hgb A1c MFr Bld 7.4 (H) 4.8 - 5.6 %   Est. average glucose Bld gHb Est-mCnc 166 mg/dL  CBC with Differential/Platelet  Result Value Ref Range   WBC 9.1 3.4 - 10.8 x10E3/uL   RBC 5.18 4.14 - 5.80 x10E6/uL   Hemoglobin 15.0 13.0 - 17.7 g/dL   Hematocrit 57.842.8 46.937.5 - 51.0 %   MCV 83 79 - 97 fL   MCH 29.0 26.6 - 33.0 pg   MCHC 35.0 31.5 - 35.7 g/dL   RDW 62.913.1 52.812.3 - 41.315.4 %   Platelets 296 150 - 379 x10E3/uL   Neutrophils 49 Not Estab. %   Lymphs 37 Not Estab. %   Monocytes 10 Not Estab. %   Eos 3 Not Estab. %   Basos 1 Not Estab. %   Neutrophils Absolute 4.5 1.4 - 7.0 x10E3/uL   Lymphocytes Absolute 3.4 (H) 0.7 - 3.1 x10E3/uL   Monocytes Absolute 0.9 0.1 - 0.9 x10E3/uL   EOS (ABSOLUTE) 0.3 0.0 - 0.4 x10E3/uL   Basophils Absolute 0.1 0.0 - 0.2 x10E3/uL   Immature Granulocytes 0 Not Estab. %   Immature Grans (Abs) 0.0 0.0 - 0.1 x10E3/uL  HIV antibody  Result Value Ref Range   HIV Screen 4th Generation wRfx Non Reactive Non Reactive      Assessment & Plan:   Problem List Items Addressed This Visit      Other   Anxiety associated with depression    Pt reports persistent anxiety symptoms.  Has had new GI complaints  since starting fluoxetine as documented above.  Plan:  1. STOP taking fluoxetine. 2. START bupropion XL 150 mg once daily.  May have fewer GI side effects. 3. Followup w/ Dr. Althea CharonKaramalegos in  4 weeks.      Relevant Medications   buPROPion (WELLBUTRIN XL) 150 MG 24 hr tablet    Other Visit Diagnoses    Community acquired pneumonia, unspecified laterality    -  Primary Acute illness. Fever responsive to NSAIDs and tylenol.  Symptoms worsening over last 7 days and is consistent with CAP s/p URI w/ symptomatic fever and productive cough.  No other systemic symptoms.  Plan: 1. Discussed pt could have cough lasting up to few weeks - START doxycycline 100 mg twice daily x 10 days. - Start Mucinex-DM OTC up to 7-10 days then stop - Can consider adding tessalon perles 100 mg every 12 hours as needed for cough. 2. Supportive care with nasal saline, warm herbal tea with honey, 3. Improve hydration 4. Tylenol / Motrin PRN fevers 5. Return criteria given   Relevant Medications   doxycycline (VIBRA-TABS) 100 MG tablet   Right upper quadrant pain     Consistent symptoms w/o worsening from exam on 04/21/17.  Pt w/ negative RUQ ultrasound.  Possible association to fluoxetine side effect.  Plan: 1. CHANGE fluoxetine to Wellbutrin XL as above. 2. Proceed w/ HIDA scan.  Pt will get phone call to complete. 3. Consider GI referral in future if no additional improvement noted. 4. Followup in clinc 4 weeks.   Relevant Orders   NM Hepato W/Eject Fract      Meds ordered this encounter  Medications  . doxycycline (VIBRA-TABS) 100 MG tablet    Sig: Take 1 tablet (100 mg total) by mouth 2 (two) times daily.    Dispense:  20 tablet    Refill:  0    Order Specific Question:   Supervising Provider    Answer:   Smitty Cords [[id number]]  . buPROPion (WELLBUTRIN XL) 150 MG 24 hr tablet    Sig: Take 1 tablet (150 mg total) by mouth daily.    Dispense:  30 tablet    Refill:  5    Order Specific  Question:   Supervising Provider    Answer:   Smitty Cords [[id number]]    Follow up plan: Return in about 4 weeks (around 05/19/2017) for anxiety and depression with Dr. Vernie Shanks, DNP, AGPCNP-BC Adult Gerontology Primary Care Nurse Practitioner [facility] 04/27/2017, 1:50 PM

## 2017-04-27 ENCOUNTER — Encounter: Payer: Self-pay | Admitting: Nurse Practitioner

## 2017-04-27 NOTE — Assessment & Plan Note (Signed)
Pt reports persistent anxiety symptoms.  Has had new GI complaints since starting fluoxetine as documented above.  Plan:  1. STOP taking fluoxetine. 2. START bupropion XL 150 mg once daily.  May have fewer GI side effects. 3. Followup w/ Dr. Althea CharonKaramalegos in 4 weeks.

## 2017-05-04 ENCOUNTER — Other Ambulatory Visit: Payer: Self-pay | Admitting: Nurse Practitioner

## 2017-05-04 DIAGNOSIS — J189 Pneumonia, unspecified organism: Secondary | ICD-10-CM

## 2017-05-16 ENCOUNTER — Ambulatory Visit: Payer: 59

## 2017-06-02 ENCOUNTER — Ambulatory Visit: Payer: 59

## 2017-06-15 ENCOUNTER — Other Ambulatory Visit: Payer: Self-pay | Admitting: Family Medicine

## 2017-06-15 DIAGNOSIS — I10 Essential (primary) hypertension: Secondary | ICD-10-CM

## 2017-06-27 ENCOUNTER — Other Ambulatory Visit: Payer: Self-pay

## 2017-06-27 ENCOUNTER — Encounter: Payer: Self-pay | Admitting: Family Medicine

## 2017-06-27 ENCOUNTER — Ambulatory Visit (INDEPENDENT_AMBULATORY_CARE_PROVIDER_SITE_OTHER): Payer: 59 | Admitting: Family Medicine

## 2017-06-27 VITALS — BP 144/85 | HR 90 | Temp 98.4°F | Resp 16 | Ht 71.0 in | Wt 272.0 lb

## 2017-06-27 DIAGNOSIS — F418 Other specified anxiety disorders: Secondary | ICD-10-CM | POA: Diagnosis not present

## 2017-06-27 DIAGNOSIS — G43819 Other migraine, intractable, without status migrainosus: Secondary | ICD-10-CM | POA: Diagnosis not present

## 2017-06-27 MED ORDER — SUMATRIPTAN SUCCINATE 25 MG PO TABS: 25.0000 mg | ORAL_TABLET | Freq: Once | ORAL | 1 refills | Status: AC | PRN

## 2017-06-27 NOTE — Patient Instructions (Addendum)
Thank you for coming to the office today.  It sounds like stress induced migraine  Treatment to STOP the headache at this time: - Start with Sumatriptan 25mg  - take 1 immediately at onset of moderate to severe migraine headache, if unresolved or return within 2 hours then repeat dose 1 tablet, that is max dose for 24 hours. - We can DOUBLE dose for 2 of the 25mg  pills = 50mg  PER DOSE if not improved after 1st or 2nd dose - let me know if working we can rx new stronger pill 50mg   Treatment to PREVENT headaches: - Goal is to avoid triggers. We need to learn more details on what are your exact or possible headache triggers first. - Keep detailed headache diary (on printed handout) for possible triggers, bring this to your next visit to discuss further - Known possible triggers include caffeine, chocolate, alcohol, stress, weather changes, certain other foods  Also be aware that OTC pain meds/anti-inflammatories can cause rebound headache, they help resolve the headache but then after the effect wears off they can CAUSE a headache. Try to taper down and stop these medications and allow them to get out of your system for 1-2 weeks  Look into some of these alternatives for Migraine Headache Prophylaxis or preventative treatment  Antidepressant Type - Venlafaxine, Amitriptyline Blood pressure meds - Metoprolol, Propanolol, Verapamil Anti Headache/Seizure/Nerve medications - Topamax, Gabapentin  NEUROLOGY / HEADACHE MIGRAINE  [facility] [address] [place], [place] [address] Phone: [phone]  If not improving headache, or worsening headache severe pain 8 to 10 out of 10, numbness, tingling, weakness, loss of vision passing out or other acute symptom, then [facility].   You have symptoms of Vertigo (Benign Paroxysmal Positional Vertigo) - This is commonly caused by inner ear fluid imbalance, sometimes can be worsened by allergies  and sinus symptoms, otherwise it can occur randomly sometimes and we may never discover the exact cause. - To treat this, try the Epley Manuever (see diagrams/instructions below) at home up to 3 times a day for 1-2 weeks or until symptoms resolve   See the next page for images describing the Epley Manuever.     ----------------------------------------------------------------------------------------------------------------------        Please schedule a Follow-up Appointment to: Return in about 4 weeks (around 07/25/2017) for headaches.    If you have any other questions or concerns, please feel free to call the office or send a message through MyChart. You may also schedule an earlier appointment if necessary.  Additionally, you may be receiving a survey about your experience at our office within a few days to 1 week by e-mail or mail. We value your feedback.  Saralyn PilarAlexander Leticia Coletta, DO [facility]

## 2017-06-27 NOTE — Progress Notes (Signed)
Subjective:    Patient ID: Curtis Paganinilijah C Mcguiness, male    DOB: [birth year], 37 y.o.   MRN: [id number]  Curtis Parrish is a 37 y.o. male presenting on 06/27/2017 for Headache (dizziness, fatigue, very painful for 2 weeks)  Patient presents for a same day appointment.   HPI   Headaches / Dizziness / Anxiety/Depression Reports symptoms started 2 weeks ago, he had a stressful day at work, and he took a break and then he started to get a headache acute onset central headache up to severe pain 9/10 on scale, lasted for 2-3 min then improved when he put pressure on head and "Held it" and he got off shift, went home showered went to bed felt better, then next day he had more of a persistent "normal" headache mild to moderate symptoms usually fine in morning when wake up but gradually develop 2-3/10 aching headache, seemed improve with rest and hot shower help, taking Tylenol 500mg  x 2 tabs every 6 hours some relief. - Last severe headache occurred 3 days ago, had another painful headache episode 4-5/10 and has had worsening headaches since - When he is trying to sleep or significant stress or fatigue he has acute other associated symptoms feeling "nausea", numbness and dizziness - He has history of chronic migraines, not on triptan or abortive therapy, he cannot tolerate ASA / NSAIDs due to allergy  He works 3rd shift, stressors at work. He had worsening frequency headaches after significant life stressor with his daughter, "had to kick her out".  Still taking Wellbutrin XL 150mg  daily - thinks it is helping, believes his stress level now is better controlled on medicine than off. Previously took Gabapentin for sciatica pain in past. Has failed Fluoxetine SSRI in past due to migraines.  Denies any loss of vision, sinus pressure or congestion, active or worsening numbness tingling, weakness  Update: - HIDA needs to be re-scheduled  Health Maintenance: Due for Flu shot agrees to get but due to current  symptoms, and near end of flu season agree to hold, he has never received flu shot vaccine before  Dep[facility] screen [facility] 2/9 06/27/2017 09/15/2016  Decreased Interest 0 1  Down, Depressed, Hopeless 0 2  PHQ - 2 Score 0 3  Altered sleeping 3 3  Tired, decreased energy 2 2  Change in appetite 2 2  Feeling bad or failure about yourself  0 1  Trouble concentrating 0 1  Moving slowly or fidgety/restless 1 0  Suicidal thoughts 0 0  PHQ-9 Score 8 12  Difficult doing work/chores Not difficult at all Somewhat difficult    Social History   Tobacco Use  . Smoking status: Current Every Day Smoker    Packs/day: 0.50    Years: 30.00    Pack years: 15.00    Types: Cigarettes  . Smokeless tobacco: Current User  Substance Use Topics  . Alcohol use: No    Comment: Abstaining from alcohol for 15 years, prior history in age 8520s  . Drug use: No    Review of Systems Per HPI unless specifically indicated above     Objective:    BP (!) 144/85 (BP Location: Right Arm, Patient Position: Sitting, Cuff Size: Large)   Pulse 90   Temp 98.4 F (36.9 C)   Resp 16   Ht 5\' 11"  (1.803 m)   Wt 272 lb (123.4 kg)   BMI 37.94 kg/m   Wt Readings from Last 3 Encounters:  06/27/17 272 lb (123.4 kg)  04/21/17 270 lb 9.6 oz (122.7 kg)  04/11/17 266 lb 12.8 oz (121 kg)    Physical Exam  Constitutional: He is oriented to person, place, and time. He appears well-developed and well-nourished. No distress.  Mostly well appearing, some slight discomfort with L sided headache, cooperative, obese  HENT:  Head: Normocephalic and atraumatic.  Mouth/Throat: Oropharynx is clear and moist.  Frontal / maxillary sinuses non-tender. Nares patent without purulence or edema. Bilateral TMs with some clear effusion and fullness, L>R without erythema  Oropharynx clear without erythema, exudates, edema or asymmetry.  Eyes: Conjunctivae are normal. Right eye exhibits no discharge. Left eye exhibits no discharge.  Neck:  Normal range of motion. Neck supple.  Cardiovascular: Normal rate, regular rhythm, normal heart sounds and intact distal pulses.  No murmur heard. Pulmonary/Chest: Effort normal and breath sounds normal. No respiratory distress. He has no wheezes. He has no rales.  Musculoskeletal: Normal range of motion. He exhibits no edema.  Lymphadenopathy:    He has no cervical adenopathy.  Neurological: He is alert and oriented to person, place, and time. No cranial nerve deficit.  Distal sensation intact to light touch  Skin: Skin is warm and dry. No rash noted. He is not diaphoretic. No erythema.  Psychiatric: His behavior is normal.  Mood appears slightly depressed and anxious, mildly tearful at times  Nursing note and vitals reviewed.  Results for orders placed or performed in visit on 09/15/16  Comprehensive metabolic panel  Result Value Ref Range   Glucose 124 (H) 65 - 99 mg/dL   BUN 9 6 - 20 mg/dL   Creatinine, Ser 1.61 0.76 - 1.27 mg/dL   GFR calc non Af Amer 104 >59 mL/min/1.73   GFR calc Af Amer 120 >59 mL/min/1.73   BUN/Creatinine Ratio 10 9 - 20   Sodium 138 134 - 144 mmol/L   Potassium 4.5 3.5 - 5.2 mmol/L   Chloride 99 96 - 106 mmol/L   CO2 24 18 - 29 mmol/L   Calcium 9.7 8.7 - 10.2 mg/dL   Total Protein 7.6 6.0 - 8.5 g/dL   Albumin 4.3 3.5 - 5.5 g/dL   Globulin, Total 3.3 1.5 - 4.5 g/dL   Albumin/Globulin Ratio 1.3 1.2 - 2.2   Bilirubin Total 0.3 0.0 - 1.2 mg/dL   Alkaline Phosphatase 82 39 - 117 IU/L   AST 20 0 - 40 IU/L   ALT 40 0 - 44 IU/L  Lipid panel  Result Value Ref Range   Cholesterol, Total 187 100 - 199 mg/dL   Triglycerides 096 (H) 0 - 149 mg/dL   HDL 31 (L) >04 mg/dL   VLDL Cholesterol Cal 32 5 - 40 mg/dL   LDL Calculated 540 (H) 0 - 99 mg/dL   Chol/HDL Ratio 6.0 (H) 0.0 - 5.0 ratio  Hemoglobin A1c  Result Value Ref Range   Hgb A1c MFr Bld 7.4 (H) 4.8 - 5.6 %   Est. average glucose Bld gHb Est-mCnc 166 mg/dL  CBC with Differential/Platelet  Result  Value Ref Range   WBC 9.1 3.4 - 10.8 x10E3/uL   RBC 5.18 4.14 - 5.80 x10E6/uL   Hemoglobin 15.0 13.0 - 17.7 g/dL   Hematocrit 98.1 19.1 - 51.0 %   MCV 83 79 - 97 fL   MCH 29.0 26.6 - 33.0 pg   MCHC 35.0 31.5 - 35.7 g/dL   RDW 47.8 29.5 - 62.1 %   Platelets 296 150 - 379 x10E3/uL   Neutrophils 49 Not Estab. %  Lymphs 37 Not Estab. %   Monocytes 10 Not Estab. %   Eos 3 Not Estab. %   Basos 1 Not Estab. %   Neutrophils Absolute 4.5 1.4 - 7.0 x10E3/uL   Lymphocytes Absolute 3.4 (H) 0.7 - 3.1 x10E3/uL   Monocytes Absolute 0.9 0.1 - 0.9 x10E3/uL   EOS (ABSOLUTE) 0.3 0.0 - 0.4 x10E3/uL   Basophils Absolute 0.1 0.0 - 0.2 x10E3/uL   Immature Granulocytes 0 Not Estab. %   Immature Grans (Abs) 0.0 0.0 - 0.1 x10E3/uL  HIV antibody  Result Value Ref Range   HIV Screen 4th Generation wRfx Non Reactive Non Reactive      Assessment & Plan:   Problem List Items Addressed This Visit    Anxiety associated with depression    Seems inadequately controlled, as stress is major source of his physical symptoms and headaches as trigger it seems Improved on Wellbutrin but not optimally controlled Failed SSRI Fluoxetine in past Consider future SNRI for headache prophylaxis and mood/anxiety control      Migraine - Primary    Consistent with persistent headache syndrome possible migraines vs other headaches, seems stress triggered. Unilateral L sided. Now seems gradually improved from before but still having severe episodes - Currently with mild active HA, well-appearing, no focal neuro deficits, tolerating PO w/o n/v - Inadequately treated for migraine HA at home Allergy to ASA / NSAID  Plan: 1. Start abortive therapy with Sumatriptan 25mg  tabs - take 1-2 PRN (#12, 0 refill due to quantity limit), severe HA, may repeat dose within 2 hr if persistent, no more in 24 hours, in future can titrate dose to 50mg  if needed. Counseling on potential side effect / intolerance with chest discomfort acutely after  taking sumatriptan 2. Continue Tylenol 1000mg  TID alternatively 3. Avoid triggers including foods, caffeine. Important to rest. - Discussion on future migraine prophylaxis medications - handout given, review options at next visit, determine need if using sumatriptan frequently 4. Given constellation of symptoms with vertigo, migraines, reported neurological focal L sided symptoms, will place referral to [facility] Neurology, also to discuss headaches in future  Return criteria given for when to go to office vs ED      Relevant Medications   SUMAtriptan (IMITREX) 25 MG tablet     #Dizziness Seems related to complex migraines, also consider BPPV vertigo with L sided predominant symptoms Handout Epley maneuver, ref Neurology  Meds ordered this encounter  Medications  . SUMAtriptan (IMITREX) 25 MG tablet    Sig: Take 1-2 tablets (25-50 mg total) by mouth once as needed for up to 1 dose for migraine. May repeat dose in 2 hr if HA persists = max 24 hr    Dispense:  12 tablet    Refill:  1   Orders Placed This Encounter  Procedures  . Ambulatory referral to Neurology    Referral Priority:   Routine    Referral Type:   Consultation    Referral Reason:   Specialty Services Required    Referred to Provider:   Lonell Face, MD    Requested Specialty:   Neurology    Number of Visits Requested:   1     Follow up plan: Return in about 4 weeks (around 07/25/2017) for headaches.  Saralyn Pilar, DO [facility] 06/28/2017, 12:55 AM

## 2017-06-28 DIAGNOSIS — G43909 Migraine, unspecified, not intractable, without status migrainosus: Secondary | ICD-10-CM | POA: Insufficient documentation

## 2017-06-28 NOTE — Assessment & Plan Note (Signed)
Seems inadequately controlled, as stress is major source of his physical symptoms and headaches as trigger it seems Improved on Wellbutrin but not optimally controlled Failed SSRI Fluoxetine in past Consider future SNRI for headache prophylaxis and mood/anxiety control

## 2017-06-28 NOTE — Assessment & Plan Note (Signed)
Consistent with persistent headache syndrome possible migraines vs other headaches, seems stress triggered. Unilateral L sided. Now seems gradually improved from before but still having severe episodes - Currently with mild active HA, well-appearing, no focal neuro deficits, tolerating PO w/o n/v - Inadequately treated for migraine HA at home Allergy to ASA / NSAID  Plan: 1. Start abortive therapy with Sumatriptan 25mg  tabs - take 1-2 PRN (#12, 0 refill due to quantity limit), severe HA, may repeat dose within 2 hr if persistent, no more in 24 hours, in future can titrate dose to 50mg  if needed. Counseling on potential side effect / intolerance with chest discomfort acutely after taking sumatriptan 2. Continue Tylenol 1000mg  TID alternatively 3. Avoid triggers including foods, caffeine. Important to rest. - Discussion on future migraine prophylaxis medications - handout given, review options at next visit, determine need if using sumatriptan frequently 4. Given constellation of symptoms with vertigo, migraines, reported neurological focal L sided symptoms, [facility], also to discuss headaches in future  Return criteria given for when to go to office vs ED

## 2017-06-29 DIAGNOSIS — R51 Headache: Secondary | ICD-10-CM | POA: Diagnosis not present

## 2017-06-29 DIAGNOSIS — R0683 Snoring: Secondary | ICD-10-CM | POA: Diagnosis not present

## 2017-06-29 DIAGNOSIS — G479 Sleep disorder, unspecified: Secondary | ICD-10-CM | POA: Diagnosis not present

## 2017-07-25 ENCOUNTER — Ambulatory Visit: Payer: 59 | Admitting: Family Medicine

## 2017-08-11 DIAGNOSIS — R0602 Shortness of breath: Secondary | ICD-10-CM | POA: Diagnosis not present

## 2017-08-11 DIAGNOSIS — G4733 Obstructive sleep apnea (adult) (pediatric): Secondary | ICD-10-CM | POA: Diagnosis not present

## 2017-08-12 DIAGNOSIS — G4733 Obstructive sleep apnea (adult) (pediatric): Secondary | ICD-10-CM | POA: Diagnosis not present

## 2017-08-12 DIAGNOSIS — R0602 Shortness of breath: Secondary | ICD-10-CM | POA: Diagnosis not present

## 2017-08-25 ENCOUNTER — Other Ambulatory Visit: Payer: Self-pay

## 2017-08-25 DIAGNOSIS — I10 Essential (primary) hypertension: Secondary | ICD-10-CM

## 2017-08-25 DIAGNOSIS — R1013 Epigastric pain: Secondary | ICD-10-CM

## 2017-08-25 DIAGNOSIS — F418 Other specified anxiety disorders: Secondary | ICD-10-CM

## 2017-08-25 MED ORDER — AMLODIPINE BESYLATE 5 MG PO TABS: 5.0000 mg | ORAL_TABLET | Freq: Every day | ORAL | 2 refills | Status: DC

## 2017-08-25 MED ORDER — OMEPRAZOLE 20 MG PO CPDR: 20.0000 mg | DELAYED_RELEASE_CAPSULE | Freq: Every day | ORAL | 1 refills | Status: DC

## 2017-08-25 MED ORDER — BUPROPION HCL ER (XL) 150 MG PO TB24: 150.0000 mg | ORAL_TABLET | Freq: Every day | ORAL | 1 refills | Status: DC

## 2018-01-11 ENCOUNTER — Other Ambulatory Visit: Payer: Self-pay | Admitting: Nurse Practitioner

## 2018-01-11 DIAGNOSIS — R1013 Epigastric pain: Secondary | ICD-10-CM

## 2018-01-11 DIAGNOSIS — F418 Other specified anxiety disorders: Secondary | ICD-10-CM

## 2018-01-15 ENCOUNTER — Other Ambulatory Visit: Payer: Self-pay

## 2018-01-15 ENCOUNTER — Emergency Department
Admission: EM | Admit: 2018-01-15 | Discharge: 2018-01-15 | Disposition: A | Discharge status: Home / Self Care | Payer: 59 | Attending: Emergency Medicine | Admitting: Emergency Medicine

## 2018-01-15 DIAGNOSIS — F419 Anxiety disorder, unspecified: Secondary | ICD-10-CM | POA: Diagnosis not present

## 2018-01-15 DIAGNOSIS — L02215 Cutaneous abscess of perineum: Secondary | ICD-10-CM | POA: Insufficient documentation

## 2018-01-15 DIAGNOSIS — F329 Major depressive disorder, single episode, unspecified: Secondary | ICD-10-CM | POA: Insufficient documentation

## 2018-01-15 DIAGNOSIS — Z87891 Personal history of nicotine dependence: Secondary | ICD-10-CM | POA: Diagnosis not present

## 2018-01-15 DIAGNOSIS — R1032 Left lower quadrant pain: Secondary | ICD-10-CM | POA: Diagnosis present

## 2018-01-15 DIAGNOSIS — I10 Essential (primary) hypertension: Secondary | ICD-10-CM | POA: Diagnosis not present

## 2018-01-15 DIAGNOSIS — Z79899 Other long term (current) drug therapy: Secondary | ICD-10-CM | POA: Diagnosis not present

## 2018-01-15 MED ORDER — LIDOCAINE-EPINEPHRINE 1 %-1:100000 IJ SOLN
30.0000 mL | Freq: Once | INTRAMUSCULAR | Status: AC
  Administered 2018-01-15: 30 mL
  Filled 2018-01-15: qty 2

## 2018-01-15 MED ORDER — SULFAMETHOXAZOLE-TRIMETHOPRIM 800-160 MG PO TABS: 1.0000 | ORAL_TABLET | Freq: Two times a day (BID) | ORAL | 0 refills | Status: DC

## 2018-01-15 NOTE — ED Triage Notes (Signed)
Pt states abscess to groin x 4 days. Denies drainage. Unsure of fever. Alert, oriented, ambulatory. States hx of same and had it drained few years ago.

## 2018-01-15 NOTE — ED Provider Notes (Signed)
[facility] Emergency Department Provider Note ____________________________________________  Time seen: 1720  I have reviewed the triage vital signs and the nursing notes.  HISTORY  Chief Complaint  Abscess   HPI Curtis Parrish is a 37 y.o. male presents to the ER with complaints of abscess to left groin. He noticed this 4 days ago. It is tender to touch. He has not noticed any drainage from the area. He denies fever, chills or body aches. He has not tried anything OTC for this. He has had an abscess in this area 2 years ago that had to be lanced.  Past Medical History:  Diagnosis Date  . Anxiety   . Depression   . Hypertension     Patient Active Problem List   Diagnosis Date Noted  . Migraine 06/28/2017  . Hyperlipidemia 09/22/2016  . Elevated hemoglobin A1c 09/21/2016  . Hidradenitis suppurativa 09/21/2016  . Erectile dysfunction 09/16/2016  . Hypertension 09/15/2016  . Anxiety associated with depression 09/15/2016  . Tobacco abuse 09/15/2016  . Obesity (BMI 30.0-34.9) 09/15/2016  . Hyperglycemia 09/15/2016    History reviewed. No pertinent surgical history.  Prior to Admission medications   Medication Sig Start Date End Date Taking? Authorizing Provider  amLODipine (NORVASC) 5 MG tablet Take 1 tablet (5 mg total) by mouth daily. 08/25/17   Galen Manila, NP  buPROPion (WELLBUTRIN XL) 150 MG 24 hr tablet TAKE 1 TABLET BY MOUTH  DAILY 01/12/18   Althea Charon, Netta Neat, DO  doxycycline (VIBRA-TABS) 100 MG tablet Take 1 tablet (100 mg total) by mouth 2 (two) times daily. 04/21/17   Galen Manila, NP  fluticasone (FLONASE) 50 MCG/ACT nasal spray Place 2 sprays into both nostrils daily. Patient not taking: Reported on 06/27/2017 04/11/17   Galen Manila, NP  omeprazole (PRILOSEC) 20 MG capsule TAKE 1 CAPSULE BY MOUTH  DAILY 01/12/18   Althea Charon, Netta Neat, DO  sucralfate (CARAFATE) 1 g tablet Take 1 tablet (1 g total) by mouth  4 (four) times daily -  with meals and at bedtime. 04/11/17   Galen Manila, NP  sulfamethoxazole-trimethoprim (BACTRIM DS,SEPTRA DS) 800-160 MG tablet Take 1 tablet by mouth 2 (two) times daily. 01/15/18   Lorre Munroe, NP  SUMAtriptan (IMITREX) 25 MG tablet Take 1-2 tablets (25-50 mg total) by mouth once as needed for up to 1 dose for migraine. May repeat dose in 2 hr if HA persists = max 24 hr 06/27/17   Karamalegos, Netta Neat, DO    Allergies Aspirin and Nsaids  Family History  Problem Relation Age of Onset  . Diabetes Mother   . Hypertension Sister     Social History Social History   Tobacco Use  . Smoking status: Former Smoker    Packs/day: 0.50    Years: 30.00    Pack years: 15.00    Types: Cigarettes  . Smokeless tobacco: Current User  Substance Use Topics  . Alcohol use: No    Comment: Abstaining from alcohol for 15 years, prior history in age 64s  . Drug use: No    Review of Systems  Constitutional: Negative for fever. Skin: Positive for abscess.   ____________________________________________  PHYSICAL EXAM:  VITAL SIGNS: ED Triage Vitals [01/15/18 1658]  Enc Vitals Group     BP (!) 154/87     Pulse Rate (!) 123     Resp 16     Temp 97.9 F (36.6 C)     Temp Source Oral  SpO2 97 %     Weight 255 lb (115.7 kg)     Height 5\' 11"  (1.803 m)     Head Circumference      Peak Flow      Pain Score 6     Pain Loc      Pain Edu?      Excl. in GC?     Constitutional: Alert and oriented. Well appearing and in no distress. Cardiovascular: Tachycardic, regular rhythm.  Respiratory: Normal respiratory effort. No wheezes/rales/rhonchi. Skin:  Abscess noted of left perineal area. Non fluctuant. No cellulitis noted.  ____________________________________________  PROCEDURES  .Marland KitchenIncision and Drainage Date/Time: 01/15/2018 6:03 PM Performed by: Lorre Munroe, NP Authorized by: Lorre Munroe, NP   Consent:    Consent obtained:  Verbal    Consent given by:  Patient   Risks discussed:  Bleeding, incomplete drainage, pain and infection   Alternatives discussed:  No treatment Location:    Type:  Abscess   Location:  Anogenital   Anogenital location:  Perineum Pre-procedure details:    Skin preparation:  Betadine Anesthesia (see MAR for exact dosages):    Anesthesia method:  Local infiltration   Local anesthetic:  Lidocaine 1% WITH epi Procedure type:    Complexity:  Complex Procedure details:    Needle aspiration: no     Incision types:  Single straight   Incision depth:  Dermal   Scalpel blade:  11   Wound management:  Probed and deloculated, irrigated with saline and extensive cleaning   Drainage:  Bloody and purulent   Drainage amount:  Copious   Wound treatment:  Wound left open   Packing materials:  None Post-procedure details:    Patient tolerance of procedure:  Tolerated well, no immediate complications    ____________________________________________  INITIAL IMPRESSION / ASSESSMENT AND PLAN / ED COURSE  Perineal Abscess:  Abscess I&D- see procedure noted eRx for Septra DS 1 tab PO BID x 10 days Wound care instructions provided ____________________________________________  FINAL CLINICAL IMPRESSION(S) / ED DIAGNOSES  Final diagnoses:  Perineal abscess      Lorre Munroe, NP 01/15/18 1807    Myrna Blazer, MD 01/15/18 2227

## 2018-01-15 NOTE — Discharge Instructions (Signed)
You have been diagnosed with a perineal abscess. We performed and I&D today. Keep the bandage on for 24 hours, then leave open to air as long as it is not draining. If draining, keep covered with guaze and tape. I have provided you with a RX for Septra DS for 10 days. Please take all the medication even if you are feeling better. Follow up with your PCP is symptoms persist, worsen.

## 2018-03-21 ENCOUNTER — Other Ambulatory Visit: Payer: Self-pay | Admitting: Nurse Practitioner

## 2018-03-21 DIAGNOSIS — I10 Essential (primary) hypertension: Secondary | ICD-10-CM

## 2018-06-01 ENCOUNTER — Other Ambulatory Visit: Payer: Self-pay | Admitting: Family Medicine

## 2018-06-01 DIAGNOSIS — R1013 Epigastric pain: Secondary | ICD-10-CM

## 2018-06-01 DIAGNOSIS — F418 Other specified anxiety disorders: Secondary | ICD-10-CM

## 2018-08-17 ENCOUNTER — Telehealth: Payer: 59 | Admitting: Unknown Physician Specialty

## 2018-08-17 DIAGNOSIS — R5081 Fever presenting with conditions classified elsewhere: Secondary | ICD-10-CM

## 2018-08-17 DIAGNOSIS — R05 Cough: Secondary | ICD-10-CM

## 2018-08-17 DIAGNOSIS — J069 Acute upper respiratory infection, unspecified: Secondary | ICD-10-CM

## 2018-08-17 DIAGNOSIS — R059 Cough, unspecified: Secondary | ICD-10-CM

## 2018-08-17 NOTE — Progress Notes (Deleted)
eco

## 2018-08-17 NOTE — Progress Notes (Deleted)
.  ecov

## 2018-08-17 NOTE — Progress Notes (Signed)
  E-Visit for [facility] Virus Screening Currently it is not recommended you seek testing at this time which is reserved for serious illness.  We recommend symptomatic care and isolation until you are without fever for at least 3 days.  Practice self isolation for a recommended 2 week period of time.    If you are to develop shortness of breath, we recommend you be evaluated for a "face to face" visit. Our Emergency Departments are best equipped to handle patients with severe symptoms.   I recommend the following:  . If you are having a true medical emergency please call 911. . If you are considered high risk for Corona virus because of a known exposure, fever, shortness of breath and cough, OR if you have severe symptoms of any kind, seek medical care at an emergency room.  . Please call ahead and tell them that you were seen by telemedicine and they have recommended that you have a face to face evaluation. [facility] Emergency Department [address] [place], [place], [place] [address] [phone]  . [facility] Emergency Department [address] [place], [place] [address] [phone]  . [facility] Emergency Department [address] [place] [address] [phone]  . [facility] Emergency Department [address] [place], [place], [place] [address] [phone]  . [facility] Emergency Department [address] [place], [place], [place] [address] [phone]  NOTE: If you entered your credit card information for this eVisit, you will not be charged. You may see a "hold" on your card for the $35 but that hold will drop off and you will not have a charge processed.   Your e-visit answers were reviewed by a board certified advanced clinical practitioner to complete your personal care plan.  Thank you for using e-Visits.

## 2018-08-22 ENCOUNTER — Encounter: Payer: Self-pay | Admitting: Family Medicine

## 2018-08-22 ENCOUNTER — Ambulatory Visit (INDEPENDENT_AMBULATORY_CARE_PROVIDER_SITE_OTHER): Payer: 59 | Admitting: Family Medicine

## 2018-08-22 ENCOUNTER — Other Ambulatory Visit: Payer: Self-pay

## 2018-08-22 DIAGNOSIS — F418 Other specified anxiety disorders: Secondary | ICD-10-CM

## 2018-08-22 DIAGNOSIS — F331 Major depressive disorder, recurrent, moderate: Secondary | ICD-10-CM | POA: Diagnosis not present

## 2018-08-22 DIAGNOSIS — R509 Fever, unspecified: Secondary | ICD-10-CM | POA: Diagnosis not present

## 2018-08-22 NOTE — Assessment & Plan Note (Signed)
Worsening depression, recurrent chronic - inadequate control on Wellbutrin monotherapy Worse with recent life stressors COVID19 work stress Failed Fluoxetine due to side effect migraine Associated secondary insomnia  Plan - Discussed options for treatment - agree for starting with double dose Wellbutrin XL 150mg  x 2 = 300mg  daily, he has existing supply of med, will try this first - cautioned on insomnia and side effect - Notify us within 2-4 weeks if not improved we can add Sertraline SSRI option as next medication for him to take WITH Wellbutrin as adjunct therapy - If not improving still we can consider referral to psychiatry or behavioral health if indicated - Follow-up

## 2018-08-22 NOTE — Progress Notes (Signed)
Virtual Visit via Telephone The purpose of this virtual visit is to provide medical care while limiting exposure to the novel coronavirus (COVID19) for both patient and office staff.  Consent was obtained for phone visit:  Yes.   Answered questions that patient had about telehealth interaction:  Yes.   I discussed the limitations, risks, security and privacy concerns of performing an evaluation and management service by telephone. I also discussed with the patient that there may be a patient responsible charge related to this service. The patient expressed understanding and agreed to proceed.  Patient Location: Home [facility]  ---------------------------------------------------------------------- Chief Complaint  Patient presents with  . Fever    low grade fever 99.9 F, diarrhea denies cough or chest tightness, chills, bodyaches onset week    S: Reviewed CMA telephone note below. I have called patient and gathered additional HPI as follows:  FEVER / DIARRHEA Reports that symptoms started about 1 week ago, with onset low grade fever 99.65F with associated diarrhea and upset stomach. Seems that his diarrhea has gradually improved now only 1-2 times daily no longer 4-5 times day, he had something similar 6 years ago. Seems to improve. His fever still seems to be fluctuating with some cold chills and occasionally associated with headache. - Not taken Tylenol, he was cautious because he cannot take NSAID - Already completed a [place] E-Visit on 08/17/18 - he was advised conservative care - see documentation  He is currently not able to return to work due to symptoms, he is in home isolation at this time Denies any high risk travel to areas of current concern for COVID19. Denies any known or suspected exposure to person with or possibly with COVID19.  Denies any sweats, body ache, cough, shortness of breath, sinus pain or pressure, headache,  abdominal pain  Major Recurrent depression, moderate - Last visit with me 06/2017, for same problem and had been treated month prior, treated with Fluoxetine then he failed this and discontinued we switched to Wellbutrin XL 150mg  daily, see prior notes for background information. - Interval update with seems improved on wellbutrin in past but then seemed to lose effectiveness for him and not helping his mood - Today patient reports he would like to change his dose or change med, to help his mood - He failed Fluoxetine due to Migraine side effect in the past - He admits insomnia, difficulty with sleep onset initiation Denies suicidal or homicidal ideation, or worsening anxiety   [facility] 08/22/2018 06/27/2017 09/15/2016  Decreased Interest 2 0 1  Down, Depressed, Hopeless 3 0 2  PHQ - 2 Score 5 0 3  Altered sleeping 3 3 3   Tired, decreased energy 3 2 2   Change in appetite 2 2 2   Feeling bad or failure about yourself  2 0 1  Trouble concentrating 2 0 1  Moving slowly or fidgety/restless 2 1 0  Suicidal thoughts 0 0 0  PHQ-9 Score 19 8 12   Difficult doing work/chores Somewhat difficult Not difficult at all Somewhat difficult   GAD 7 : Generalized Anxiety Score 09/15/2016  Nervous, Anxious, on Edge 2  Control/stop worrying 1  Worry too much - different things 2  Trouble relaxing 2  Restless 1  Easily annoyed or irritable 2  Afraid - awful might happen 1  Total GAD 7 Score 11  Anxiety Difficulty Somewhat difficult    -------------------------------------------------------------------------- O: No physical exam performed due to remote telephone encounter.  --------------------------------------------------------------------------  A&P:   Suspected Acute Viral Syndrome Febrile Illness w/ Diarrhea - unspecified Unclear exact cause of episodic fever, he was working in community so possible could be community spread COVID19 but it does not seem to have any characteristic  features - may be other viral syndrome or hay fever allergies or could be related to his GI symptoms - Reassuring without high risk symptoms - Cough and without dyspnea - No comorbid pulmonary conditions (asthma, COPD) or immunocompromise  - Currently patient is LOW RISK for COVID19 based on current symptoms and no known travel/exposure - however at this time, now COVID19 is currently within phase of community spread, and therefore patient can still be potentially exposed. Cannot rule out case with mild symptoms. Testing is not recommended at this time due to mild symptoms.  1. Due to his work restrictions - he will remain OUT OF WORK in HOME ISOLATION See below until fever free x 3 days 2. May take Tylenol PRN - he cannot take NSAID - also not to take NSAID with possible COVID 3. We do not offer COVID19 testing - for this patient not high risk 4. Supportive care 5. For diarrhea - improve nutrition, hydration, electrolytes, may take Imodium PRN   Problem List Items Addressed This Visit    Moderate recurrent major depression (HCC) - Primary    Worsening depression, recurrent chronic - inadequate control on Wellbutrin monotherapy Worse with recent life stressors COVID19 work stress Failed Fluoxetine due to side effect migraine Associated secondary insomnia  Plan - Discussed options for treatment - agree for starting with double dose Wellbutrin XL 150mg  x 2 = 300mg  daily, he has existing supply of med, will try this first - cautioned on insomnia and side effect - Notify us within 2-4 weeks if not improved we can add Sertraline SSRI option as next medication for him to take WITH Wellbutrin as adjunct therapy - If not improving still we can consider referral to psychiatry or behavioral health if indicated - Follow-up      Relevant Medications   buPROPion (WELLBUTRIN XL) 150 MG 24 hr tablet    Other Visit Diagnoses    Anxiety associated with depression       Relevant Medications    buPROPion (WELLBUTRIN XL) 150 MG 24 hr tablet      No orders of the defined types were placed in this encounter.  REQUIRED self quarantine to AVOID POTENTIAL SPREAD - advised to avoid all exposure with others while during treatment. Should continue to quarantine for up to 7-14 days, pending resolution of symptoms, if symptoms resolve by 7 days and is afebrile >3 days - may STOP self quarantine at that time.  NOTIFY us WHEN 3 days FEVER free off tylenol we can write work note for him to return.  If symptoms do not resolve or significantly improve OR if WORSENING - fever / cough - or worsening shortness of breath - then should contact us and seek advice on next steps in treatment at home vs where/when to seek care at Urgent Care or Hospital ED for further intervention and possible testing if indicated.  Patient verbalizes understanding with the above medical recommendations including the limitation of remote medical advice.  Specific follow-up / call-back criteria were given for patient to follow-up or seek medical care more urgently if needed.   - Time spent in direct consultation with patient on phone: 23 minutes  Saralyn Pilar, DO [facility] 08/22/2018, 11:25 AM

## 2018-08-22 NOTE — Patient Instructions (Addendum)
INCREASE Wellbutrin XL 150mg  daily from one pill a day up to TWO pills every day - call us within 2 weeks if effective we can order new pill 300mg  dosage  OR if not effective we can order a new med to add on Sertraline (Zoloft)  Call us to notify when fever free off tylenol for 3 days then we can release you to work  If you have any other questions or concerns, please feel free to call the office or send a message through MyChart. You may also schedule an earlier appointment if necessary.  Additionally, you may be receiving a survey about your experience at our office within a few days to 1 week by e-mail or mail. We value your feedback.  Saralyn Pilar, DO [facility], [place]

## 2018-08-25 ENCOUNTER — Other Ambulatory Visit: Payer: Self-pay | Admitting: Family Medicine

## 2018-08-25 DIAGNOSIS — R1013 Epigastric pain: Secondary | ICD-10-CM

## 2018-08-25 DIAGNOSIS — F418 Other specified anxiety disorders: Secondary | ICD-10-CM

## 2018-08-28 IMAGING — US US ABDOMEN LIMITED
1 series · 14 of 25 positions shown · non-contrast
Comparison: None.

CLINICAL DATA: Right upper quadrant pain for several months

EXAM:
ULTRASOUND ABDOMEN LIMITED RIGHT UPPER QUADRANT

[Series 1: us abdomen limited · 0.22mm/px · 14 of 47 slices shown]
[im 1/47]
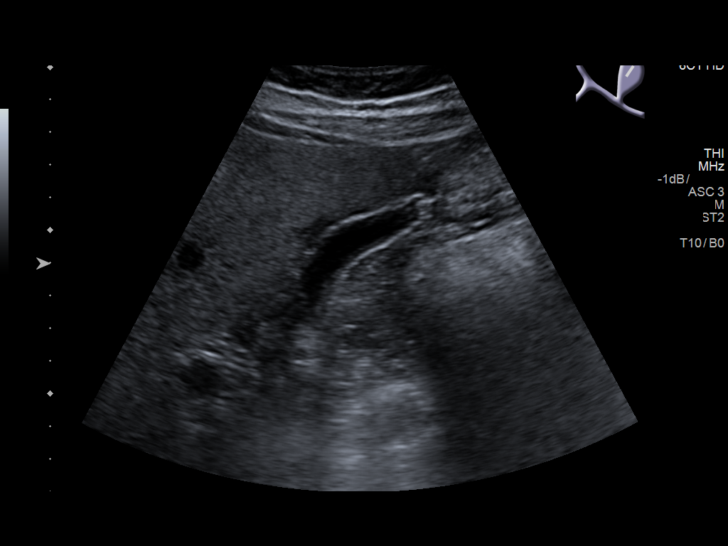
[im 4/47]
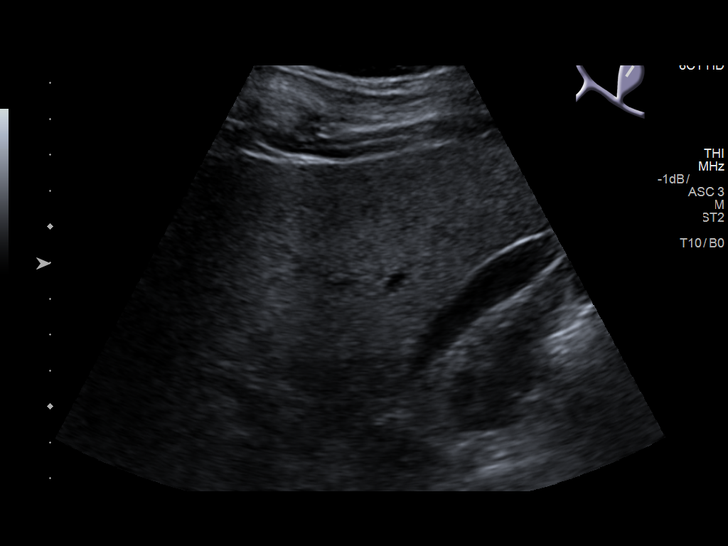
[im 8/47]
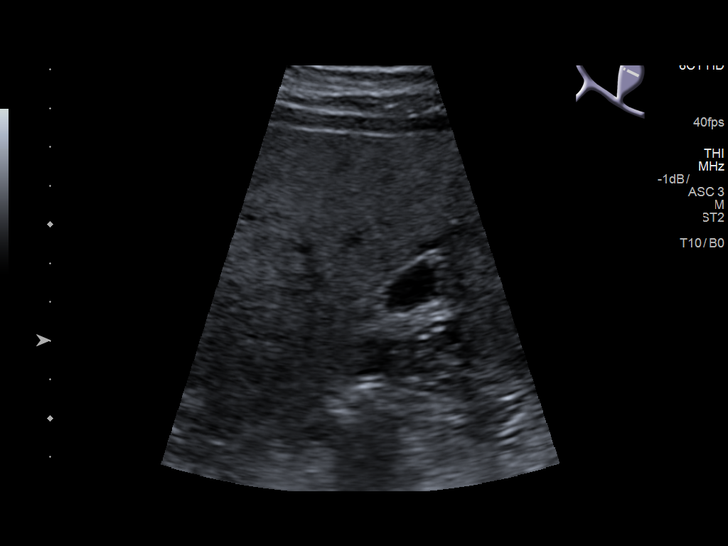
[im 12/47]
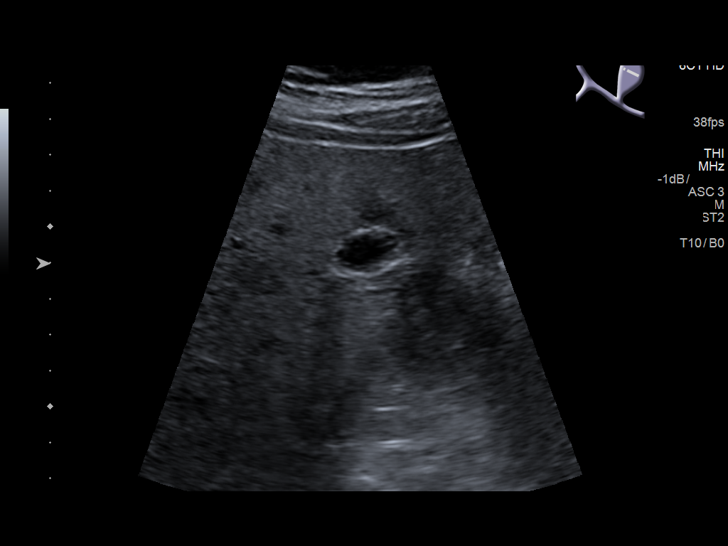
[im 16/47]
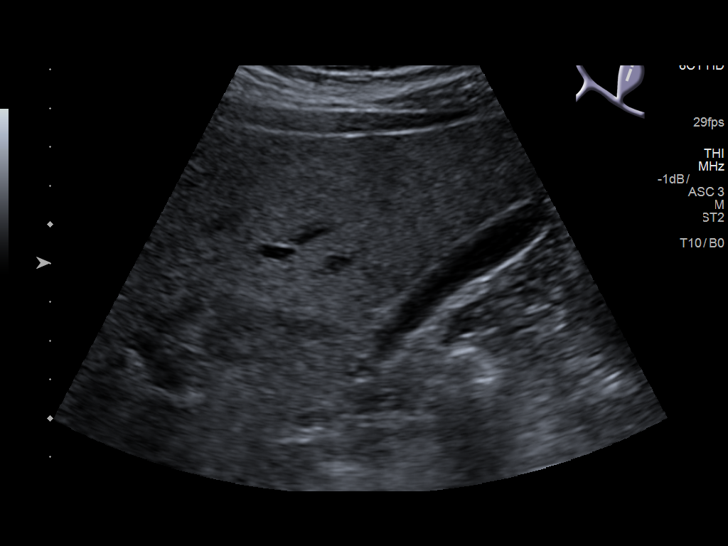
[im 18/47]
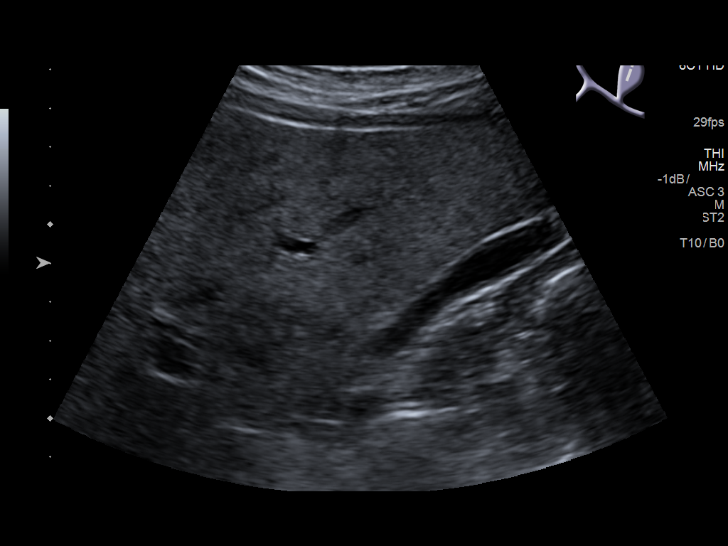
[im 22/47]
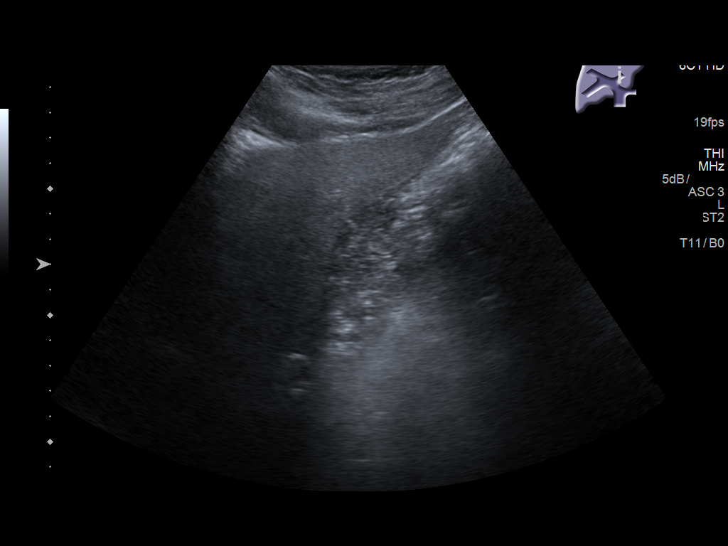
[im 25/47]
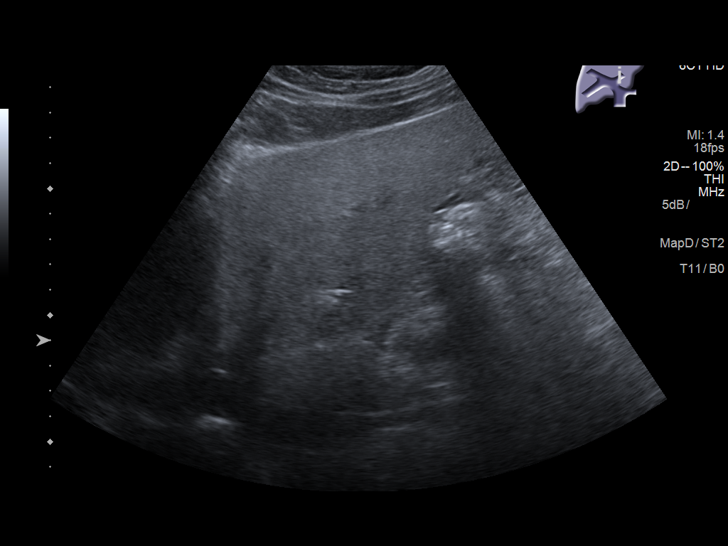
[im 29/47]
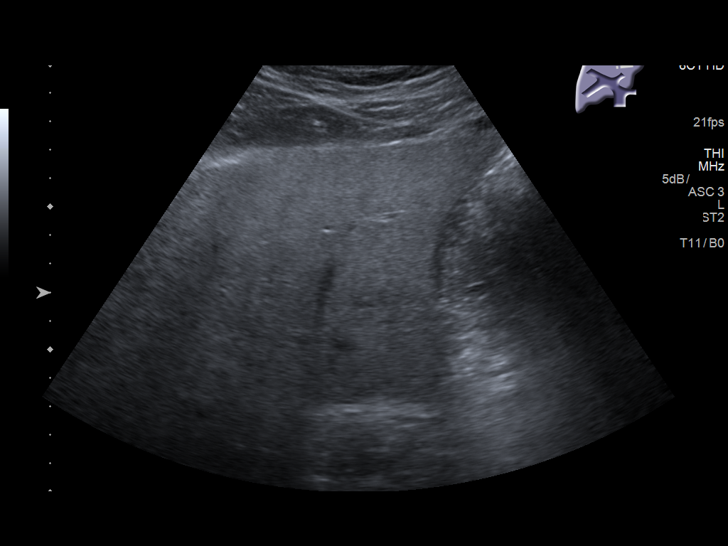
[im 31/47]
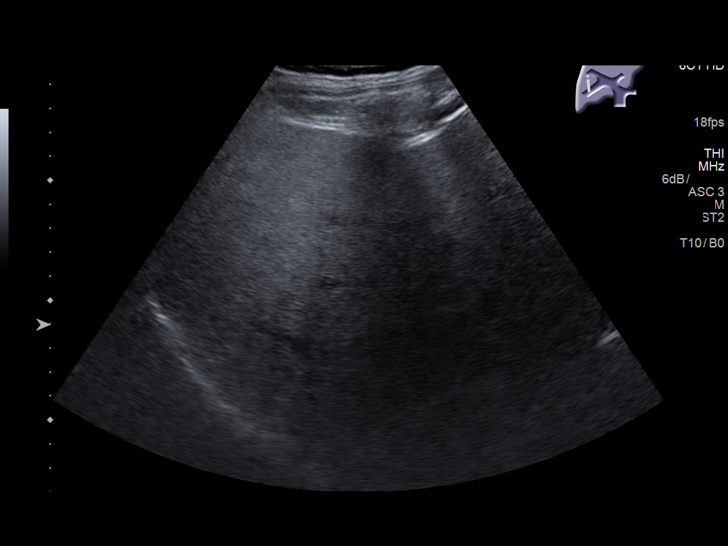
[im 35/47]
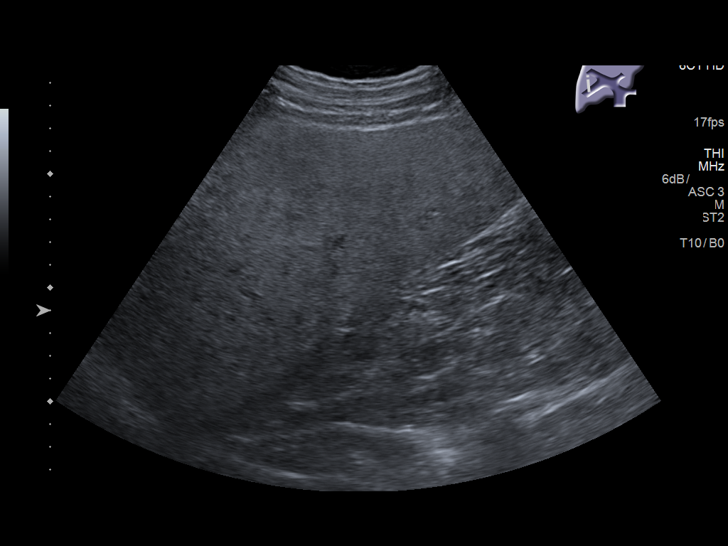
[im 39/47]
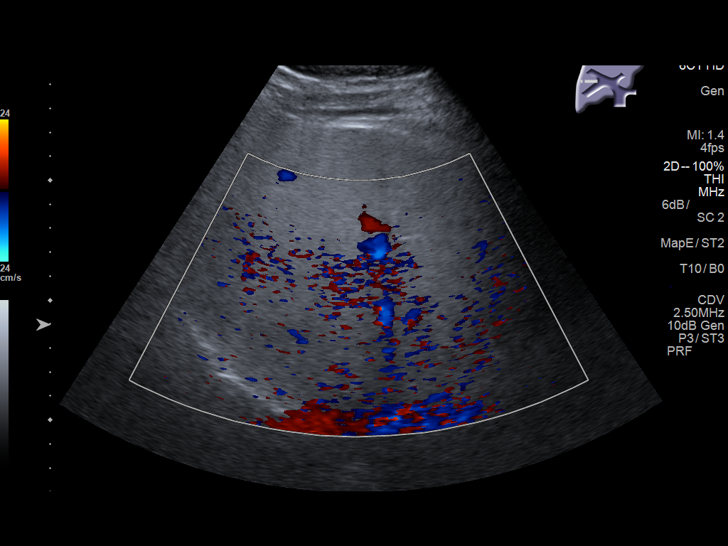
[im 43/47]
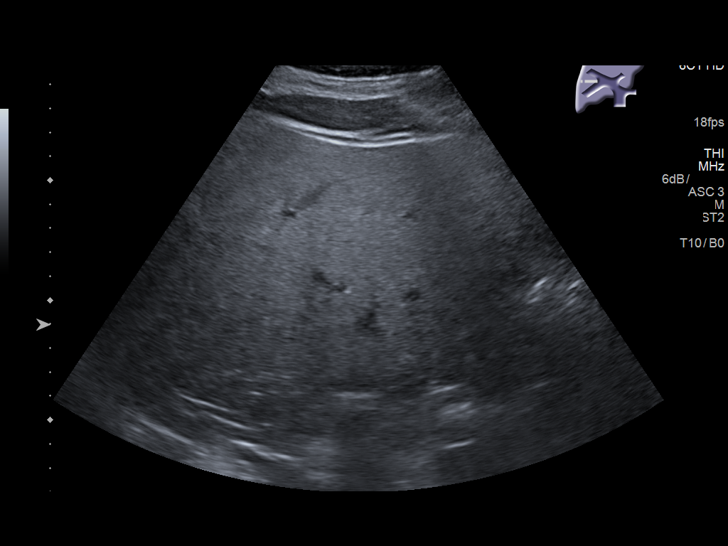
[im 47/47]
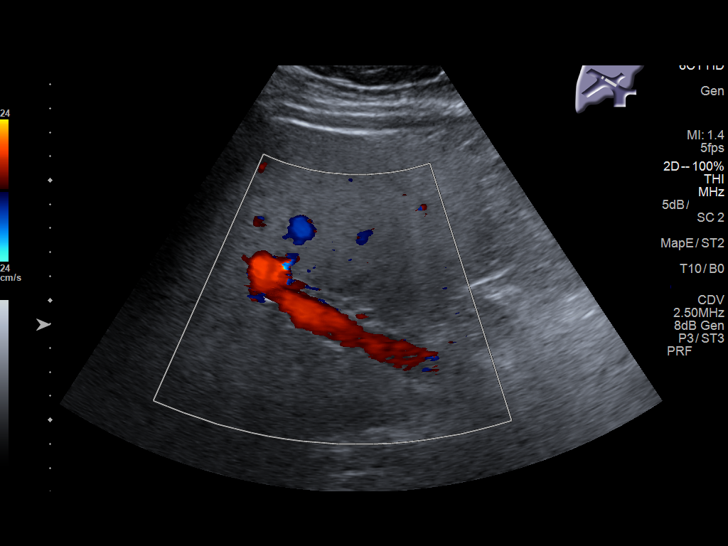

[14 of 25 positions shown; findings below may reference images not displayed]

FINDINGS: Gallbladder:

Contracted without evidence of significant wall thickening or
pericholecystic fluid. No evidence of cholelithiasis is noted.

Common bile duct:

Diameter: 3.9 mm.

Liver:

Slight increased echogenicity consistent with fatty infiltration. An
area of focal fatty sparing is noted adjacent to the gallbladder.
Portal vein is patent on color Doppler imaging with normal direction
of blood flow towards the liver.
IMPRESSION: Fatty infiltration of the liver. No other focal abnormality is
noted.

## 2018-12-01 ENCOUNTER — Other Ambulatory Visit: Payer: Self-pay | Admitting: Family Medicine

## 2018-12-01 DIAGNOSIS — I10 Essential (primary) hypertension: Secondary | ICD-10-CM

## 2019-01-15 ENCOUNTER — Other Ambulatory Visit: Payer: Self-pay | Admitting: Family Medicine

## 2019-01-15 DIAGNOSIS — F418 Other specified anxiety disorders: Secondary | ICD-10-CM

## 2019-02-11 ENCOUNTER — Other Ambulatory Visit: Payer: Self-pay | Admitting: Family Medicine

## 2019-02-11 DIAGNOSIS — R1013 Epigastric pain: Secondary | ICD-10-CM

## 2019-08-05 ENCOUNTER — Other Ambulatory Visit: Payer: Self-pay | Admitting: Family Medicine

## 2019-08-05 DIAGNOSIS — I10 Essential (primary) hypertension: Secondary | ICD-10-CM

## 2019-10-25 ENCOUNTER — Other Ambulatory Visit: Payer: Self-pay | Admitting: Family Medicine

## 2019-10-25 DIAGNOSIS — I10 Essential (primary) hypertension: Secondary | ICD-10-CM

## 2019-10-25 NOTE — Telephone Encounter (Signed)
Requested  medications are  due for refill today yes  Requested medications are on the active medication list yes  Last refill 09/04/19  Future visit scheduled no  Last visit over one year ago  Notes to clinic Failed protocol due to visit more than 6 months ago

## 2019-10-26 NOTE — Telephone Encounter (Signed)
Pt set up for 10/29/2019 for med refill appt

## 2019-10-29 ENCOUNTER — Ambulatory Visit (INDEPENDENT_AMBULATORY_CARE_PROVIDER_SITE_OTHER): Payer: 59 | Admitting: Family Medicine

## 2019-10-29 ENCOUNTER — Other Ambulatory Visit: Payer: Self-pay

## 2019-10-29 ENCOUNTER — Encounter: Payer: Self-pay | Admitting: Family Medicine

## 2019-10-29 ENCOUNTER — Other Ambulatory Visit: Payer: Self-pay | Admitting: Family Medicine

## 2019-10-29 VITALS — BP 139/83 | HR 86 | Temp 97.1°F | Resp 16 | Ht 70.0 in | Wt 235.0 lb

## 2019-10-29 DIAGNOSIS — E669 Obesity, unspecified: Secondary | ICD-10-CM

## 2019-10-29 DIAGNOSIS — F331 Major depressive disorder, recurrent, moderate: Secondary | ICD-10-CM | POA: Diagnosis not present

## 2019-10-29 DIAGNOSIS — I10 Essential (primary) hypertension: Secondary | ICD-10-CM

## 2019-10-29 DIAGNOSIS — E782 Mixed hyperlipidemia: Secondary | ICD-10-CM

## 2019-10-29 DIAGNOSIS — Z0000 Encounter for general adult medical examination without abnormal findings: Secondary | ICD-10-CM

## 2019-10-29 DIAGNOSIS — R7309 Other abnormal glucose: Secondary | ICD-10-CM

## 2019-10-29 MED ORDER — SERTRALINE HCL 50 MG PO TABS: 50.0000 mg | ORAL_TABLET | Freq: Every day | ORAL | 0 refills | Status: DC

## 2019-10-29 MED ORDER — AMLODIPINE BESYLATE 10 MG PO TABS: 10.0000 mg | ORAL_TABLET | Freq: Every day | ORAL | 3 refills | Status: DC

## 2019-10-29 NOTE — Patient Instructions (Addendum)
Thank you for coming to the office today.  Re order Amlodipine 10mg   STOP Wellbutrin - start Sertraline 50mg  daily in morning, can increase gradually up to 2 pills or 100mg  after 3-4 weeks.   If doing well can keep on the plan for physical and labs. If not doing as well and need to discuss mood - we can even do a telephone or virtual visit BEFORE you come in for physical.   DUE for FASTING BLOOD WORK (no food or drink after midnight before the lab appointment, only water or coffee without cream/sugar on the morning of)  SCHEDULE "Lab Only" visit in the morning at the clinic for lab draw in 4 WEEKS   - Make sure Lab Only appointment is at about 1 week before your next appointment, so that results will be available  For Lab Results, once available within 2-3 days of blood draw, you can can log in to MyChart online to view your results and a brief explanation. Also, we can discuss results at next follow-up visit.   Please schedule a Follow-up Appointment to: Return in about 4 weeks (around 11/26/2019) for Annual Physical.  If you have any other questions or concerns, please feel free to call the office or send a message through MyChart. You may also schedule an earlier appointment if necessary.  Additionally, you may be receiving a survey about your experience at our office within a few days to 1 week by e-mail or mail. We value your feedback.  , DO [facility], 01/27/2020

## 2019-10-29 NOTE — Assessment & Plan Note (Signed)
Improved HTN control on higher dose Amlodipine now - No known complication - Allergy to ASA 81 (NSAIDs)  Plan: 1. Re order Amlodipine 10mg  daily to mail order [facility] 2. Reduce energy drinks, caffeine, improve hydration 3. Improve regular exercise 4. Monitor home BP readings  F/u 4 weeks physical

## 2019-10-29 NOTE — Progress Notes (Signed)
Subjective:    Patient ID: Curtis Parrish, male    DOB: 07-04-1980, 39 y.o.   MRN: [id number]  Curtis Parrish is a 39 y.o. male presenting on 10/29/2019 for Depression and Anxiety   HPI   CHRONIC HTN: Reports has been monitoring BP with better results on 10mg . Needs re order. Current Meds - Amlodipine 10mg  (was on 5 now he has doubled dose)   Reports good compliance, took meds today. Tolerating well, w/o complaints. Denies CP, dyspnea, HA, edema, dizziness / lightheadedness  Major Recurrent depression, moderate Previous discussion with Wellbutrin, felt x 2 Wellbutrin XL 150mg  x2 = 300mg  daily he would do well for 1-2 weeks, then after 4-5 weeks he felt like the medicine would no longer work. He has had several cycles with ineffective results. He also has said he has found solid wellbutrin pills in his bowel movement stool in past, and he managed to get a pill or two from toilet and put it in a specimen bottle he brought with him today to show. see prior notes for background information. - Today patient reports he would like to change his medication His job has changed now he has less stress overall - He failed Fluoxetine due to Migraine side effect in the past - He admits insomnia, difficulty with sleep onset initiation Denies suicidal or homicidal ideation, or worsening anxiety  Indigestion / Bloating Gas Symptoms Persistent but not GERD related. Has PPI omeprazole rarely takes PRN  Health Maintenance: Not updated on COVID19 vaccine.  Depression screen [facility] 2/9 10/29/2019 08/22/2018 06/27/2017  Decreased Interest 3 2 0  Down, Depressed, Hopeless 2 3 0  PHQ - 2 Score 5 5 0  Altered sleeping 3 3 3   Tired, decreased energy 1 3 2   Change in appetite 2 2 2   Feeling bad or failure about yourself  2 2 0  Trouble concentrating 2 2 0  Moving slowly or fidgety/restless 1 2 1   Suicidal thoughts 0 0 0  PHQ-9 Score 16 19 8   Difficult doing work/chores Somewhat difficult Somewhat difficult Not  difficult at all   GAD 7 : Generalized Anxiety Score 10/29/2019 09/15/2016  Nervous, Anxious, on Edge 2 2  Control/stop worrying 1 1  Worry too much - different things 1 2  Trouble relaxing 2 2  Restless 1 1  Easily annoyed or irritable 1 2  Afraid - awful might happen 1 1  Total GAD 7 Score 9 11  Anxiety Difficulty Somewhat difficult Somewhat difficult     Social History   Tobacco Use  . Smoking status: Former Smoker    Packs/day: 0.50    Years: 30.00    Pack years: 15.00    Types: Cigarettes  . Smokeless tobacco: Current User  Vaping Use  . Vaping Use: Every day  Substance Use Topics  . Alcohol use: No    Comment: Abstaining from alcohol for 15 years, prior history in age 1s  . Drug use: No    Review of Systems Per HPI unless specifically indicated above     Objective:    BP 139/83   Pulse 86   Temp (!) 97.1 F (36.2 C) (Temporal)   Resp 16   Ht 5\' 10"  (1.778 m)   Wt 235 lb (106.6 kg)   SpO2 99%   BMI 33.72 kg/m   Wt Readings from Last 3 Encounters:  10/29/19 235 lb (106.6 kg)  01/15/18 255 lb (115.7 kg)  06/27/17 272 lb (123.4 kg)  Physical Exam Vitals and nursing note reviewed.  Constitutional:      General: He is not in acute distress.    Appearance: He is well-developed. He is not diaphoretic.     Comments: Well-appearing, comfortable, cooperative  HENT:     Head: Normocephalic and atraumatic.  Eyes:     General:        Right eye: No discharge.        Left eye: No discharge.     Conjunctiva/sclera: Conjunctivae normal.  Neck:     Thyroid: No thyromegaly.  Cardiovascular:     Rate and Rhythm: Normal rate and regular rhythm.     Heart sounds: Normal heart sounds. No murmur heard.   Pulmonary:     Effort: Pulmonary effort is normal. No respiratory distress.     Breath sounds: Normal breath sounds. No wheezing or rales.  Musculoskeletal:        General: Normal range of motion.     Cervical back: Normal range of motion and neck supple.    Lymphadenopathy:     Cervical: No cervical adenopathy.  Skin:    General: Skin is warm and dry.     Findings: No erythema or rash.  Neurological:     Mental Status: He is alert and oriented to person, place, and time.  Psychiatric:        Behavior: Behavior normal.     Comments: Well groomed, good eye contact, normal speech and thoughts      Results for orders placed or performed in visit on 09/15/16  Comprehensive metabolic panel  Result Value Ref Range   Glucose 124 (H) 65 - 99 mg/dL   BUN 9 6 - 20 mg/dL   Creatinine, Ser 0.93 0.76 - 1.27 mg/dL   GFR calc non Af Amer 104 >59 mL/min/1.73   GFR calc Af Amer 120 >59 mL/min/1.73   BUN/Creatinine Ratio 10 9 - 20   Sodium 138 134 - 144 mmol/L   Potassium 4.5 3.5 - 5.2 mmol/L   Chloride 99 96 - 106 mmol/L   CO2 24 18 - 29 mmol/L   Calcium 9.7 8.7 - 10.2 mg/dL   Total Protein 7.6 6.0 - 8.5 g/dL   Albumin 4.3 3.5 - 5.5 g/dL   Globulin, Total 3.3 1.5 - 4.5 g/dL   Albumin/Globulin Ratio 1.3 1.2 - 2.2   Bilirubin Total 0.3 0.0 - 1.2 mg/dL   Alkaline Phosphatase 82 39 - 117 IU/L   AST 20 0 - 40 IU/L   ALT 40 0 - 44 IU/L  Lipid panel  Result Value Ref Range   Cholesterol, Total 187 100 - 199 mg/dL   Triglycerides 161 (H) 0 - 149 mg/dL   HDL 31 (L) >39 mg/dL   VLDL Cholesterol Cal 32 5 - 40 mg/dL   LDL Calculated 124 (H) 0 - 99 mg/dL   Chol/HDL Ratio 6.0 (H) 0.0 - 5.0 ratio  Hemoglobin A1c  Result Value Ref Range   Hgb A1c MFr Bld 7.4 (H) 4.8 - 5.6 %   Est. average glucose Bld gHb Est-mCnc 166 mg/dL  CBC with Differential/Platelet  Result Value Ref Range   WBC 9.1 3.4 - 10.8 x10E3/uL   RBC 5.18 4.14 - 5.80 x10E6/uL   Hemoglobin 15.0 13.0 - 17.7 g/dL   Hematocrit 42.8 37.5 - 51.0 %   MCV 83 79 - 97 fL   MCH 29.0 26.6 - 33.0 pg   MCHC 35.0 31 - 35 g/dL   RDW 13.1 12.3 -  15.4 %   Platelets 296 150 - 379 x10E3/uL   Neutrophils 49 Not Estab. %   Lymphs 37 Not Estab. %   Monocytes 10 Not Estab. %   Eos 3 Not Estab. %    Basos 1 Not Estab. %   Neutrophils Absolute 4.5 1 - 7 x10E3/uL   Lymphocytes Absolute 3.4 (H) 0 - 3 x10E3/uL   Monocytes Absolute 0.9 0 - 0 x10E3/uL   EOS (ABSOLUTE) 0.3 0.0 - 0.4 x10E3/uL   Basophils Absolute 0.1 0 - 0 x10E3/uL   Immature Granulocytes 0 Not Estab. %   Immature Grans (Abs) 0.0 0.0 - 0.1 x10E3/uL  HIV antibody  Result Value Ref Range   HIV Screen 4th Generation wRfx Non Reactive Non Reactive      Assessment & Plan:   Problem List Items Addressed This Visit    Moderate recurrent major depression (HCC)    Chronic depression, recurrent chronic - inadequate control on Wellbutrin monotherapy having issue with ineffective Reduced work stress now new job Failed Fluoxetine due to side effect migraine Associated secondary insomnia  Plan - Discontinue Wellbutrin - START SSRI Sertraline 50mg  daily with meal, may dose inc up to 75 to 100mg  after 3-4 weeks for titration of dose, review benefits - If not improving still we can consider referral to psychiatry or behavioral health if indicated or other meds such as Trazodone or other mood medication - Follow-up sooner within 3-4 weeks virtual if need to adjust plan, otherwise keep upcoming annual physical      Relevant Medications   sertraline (ZOLOFT) 50 MG tablet   Hypertension - Primary    Improved HTN control on higher dose Amlodipine now - No known complication - Allergy to ASA 81 (NSAIDs)  Plan: 1. Re order Amlodipine 10mg  daily to mail order [facility] 2. Reduce energy drinks, caffeine, improve hydration 3. Improve regular exercise 4. Monitor home BP readings  F/u 4 weeks physical      Relevant Medications   amLODipine (NORVASC) 10 MG tablet       Meds ordered this encounter  Medications  . amLODipine (NORVASC) 10 MG tablet    Sig: Take 1 tablet (10 mg total) by mouth daily.    Dispense:  90 tablet    Refill:  3  . sertraline (ZOLOFT) 50 MG tablet    Sig: Take 1 tablet (50 mg total) by mouth daily with  breakfast. May increase after 3-4 weeks up to max of 2 pills per dose for 100mg     Dispense:  90 tablet    Refill:  0      Follow up plan: Return in about 4 weeks (around 11/26/2019) for Annual Physical.  Future labs ordered for 11/21/19  , DO [facility] 10/29/2019, 1:37 PM

## 2019-10-29 NOTE — Assessment & Plan Note (Addendum)
Chronic depression, recurrent chronic - inadequate control on Wellbutrin monotherapy having issue with ineffective Reduced work stress now new job Failed Fluoxetine due to side effect migraine Associated secondary insomnia  Plan - Discontinue Wellbutrin - START SSRI Sertraline 50mg  daily with meal, may dose inc up to 75 to 100mg  after 3-4 weeks for titration of dose, review benefits - If not improving still we can consider referral to psychiatry or behavioral health if indicated or other meds such as Trazodone or other mood medication - Follow-up sooner within 3-4 weeks virtual if need to adjust plan, otherwise keep upcoming annual physical

## 2019-11-21 ENCOUNTER — Other Ambulatory Visit: Payer: Self-pay

## 2019-11-21 ENCOUNTER — Other Ambulatory Visit: Payer: 59

## 2019-11-21 DIAGNOSIS — R7309 Other abnormal glucose: Secondary | ICD-10-CM

## 2019-11-21 DIAGNOSIS — F331 Major depressive disorder, recurrent, moderate: Secondary | ICD-10-CM

## 2019-11-21 DIAGNOSIS — Z0000 Encounter for general adult medical examination without abnormal findings: Secondary | ICD-10-CM

## 2019-11-21 DIAGNOSIS — E782 Mixed hyperlipidemia: Secondary | ICD-10-CM

## 2019-11-21 DIAGNOSIS — E669 Obesity, unspecified: Secondary | ICD-10-CM

## 2019-11-21 DIAGNOSIS — I10 Essential (primary) hypertension: Secondary | ICD-10-CM

## 2019-11-22 ENCOUNTER — Telehealth: Payer: Self-pay

## 2019-11-22 NOTE — Telephone Encounter (Signed)
Spoke to the [facility] lab calling for Glucose level -444 mg/dL after repeated analysis.

## 2019-11-22 NOTE — Telephone Encounter (Signed)
Copied from CRM [id number]. Topic: General - Other >> Nov 22, 2019  1:08 PM Dawna Part D wrote: Reason for CRM: [facility] is calling to report critical lab results on the pt Reference # [id number]

## 2019-11-22 NOTE — Telephone Encounter (Signed)
Acknowledged.  Saralyn Pilar, DO [facility] 11/22/2019, 2:07 PM

## 2019-11-26 ENCOUNTER — Encounter: Payer: Self-pay | Admitting: Family Medicine

## 2019-11-26 ENCOUNTER — Other Ambulatory Visit: Payer: Self-pay

## 2019-11-26 ENCOUNTER — Ambulatory Visit (INDEPENDENT_AMBULATORY_CARE_PROVIDER_SITE_OTHER): Payer: 59 | Admitting: Family Medicine

## 2019-11-26 VITALS — BP 133/77 | HR 84 | Temp 97.5°F | Resp 16 | Ht 71.0 in | Wt 234.0 lb

## 2019-11-26 DIAGNOSIS — L732 Hidradenitis suppurativa: Secondary | ICD-10-CM | POA: Diagnosis not present

## 2019-11-26 DIAGNOSIS — E669 Obesity, unspecified: Secondary | ICD-10-CM

## 2019-11-26 DIAGNOSIS — E782 Mixed hyperlipidemia: Secondary | ICD-10-CM

## 2019-11-26 DIAGNOSIS — I10 Essential (primary) hypertension: Secondary | ICD-10-CM

## 2019-11-26 DIAGNOSIS — F3342 Major depressive disorder, recurrent, in full remission: Secondary | ICD-10-CM

## 2019-11-26 DIAGNOSIS — R7309 Other abnormal glucose: Secondary | ICD-10-CM

## 2019-11-26 DIAGNOSIS — Z0000 Encounter for general adult medical examination without abnormal findings: Secondary | ICD-10-CM

## 2019-11-26 NOTE — Assessment & Plan Note (Signed)
Improved HTN control - No known complication - Allergy to ASA 81 (NSAIDs)  Plan: 1. Continue Amlodipine 10mg  daily 2. Keep reducing energy drinks, caffeine, improve hydration 3. Improve regular exercise 4. Monitor home BP readings

## 2019-11-26 NOTE — Assessment & Plan Note (Signed)
Chronic depression, recurrent chronic - now in remission on Sertraline Failed Fluoxetine due to side effect migraine Ineffective / off Wellbutrin Associated secondary insomnia  Plan Continue sertraline 50mg  daily Will offer Trazodone 50mg  nightly for maintenance for sleep if interested, notify office  - If not improving still we can consider referral to psychiatry or behavioral health if indicated or other meds such as Trazodone or other mood medication

## 2019-11-26 NOTE — Assessment & Plan Note (Signed)
Repeat lipid panel, fasting °

## 2019-11-26 NOTE — Assessment & Plan Note (Signed)
Very concerned at risk of T2DM Last A1c 7.4 (2018) - then patient lost to follow-up Recent lab suspect inaccurate 11/21/19 - awaiting repeat lab.  Plan Discussion on management for hyperglycemia, if indicated if new dx T2DM will offer start Metformin titration 500 twice a day up to 1000 BID future consider GLP1 therapy

## 2019-11-26 NOTE — Progress Notes (Signed)
Subjective:    Patient ID: Curtis Parrish, male    DOB: 1980-06-01, 39 y.o.   MRN: [id number]  Curtis Parrish is a 39 y.o. male presenting on 11/26/2019 for Annual Exam   HPI   Here for Annual Physical and Lab Review - note issue with the lab orders they are being re-drawn now. Question the accuracy of the last lab draw given a lab mix up error, results from 11/21/19 will be voided and refunded.   CHRONIC HTN: Reports has been monitoring BP with better results on 10mg  Current Meds - Amlodipine 10mg   Reports good compliance, took meds today. Tolerating well, w/o complaints. Denies CP, dyspnea, HA, edema, dizziness / lightheadedness  Elevated A1c / Hyperlipidemia / Obesity BMI >32 Prior results abnormal. Note last A1c was 7.4 in 2018 then he was lost to follow-up. However repeat now suspect may be inaccurate - will await repeat lab draw in 1 week approximately. He is working on lifestyle diet now  Major Recurrent depression, in remission Insomnia He is doing better back on Sertraline prefers 50mg  daily dose, has not increased, admits in past on sertraline or similar med he has had mild tremor of hands and zones out at times. - He admits tossing and turning, difficult falling asleep with insomnia. Off wellbutrin - he has not tried melatonin recently, interested in sleep hygiene - He failed Fluoxetine due to Migraine side effect in the past - He admits insomnia, difficulty with sleep onset initiation Denies suicidal or homicidal ideation, or worsening anxiety  Additional problem  Hidraadenitis Chronic Previous history axillary, now mostly groin / testicular In past with skin bump boils, he has benefited from keflex antibiotic or other, he has done self drainage and maintenance, cleaning using alcohol, warm water soaks. Has not tried bleach bath. No active areas today  Health Maintenance: Not updated on COVID19 vaccine.   Depression screen [facility] 2/9 11/26/2019 10/29/2019 08/22/2018    Decreased Interest 0 3 2  Down, Depressed, Hopeless 0 2 3  PHQ - 2 Score 0 5 5  Altered sleeping 0 3 3  Tired, decreased energy 0 1 3  Change in appetite 0 2 2  Feeling bad or failure about yourself  0 2 2  Trouble concentrating 0 2 2  Moving slowly or fidgety/restless 0 1 2  Suicidal thoughts 0 0 0  PHQ-9 Score 0 16 19  Difficult doing work/chores Not difficult at all Somewhat difficult Somewhat difficult   GAD 7 : Generalized Anxiety Score 10/29/2019 09/15/2016  Nervous, Anxious, on Edge 2 2  Control/stop worrying 1 1  Worry too much - different things 1 2  Trouble relaxing 2 2  Restless 1 1  Easily annoyed or irritable 1 2  Afraid - awful might happen 1 1  Total GAD 7 Score 9 11  Anxiety Difficulty Somewhat difficult Somewhat difficult     Past Medical History:  Diagnosis Date  . Anxiety   . Depression   . Hypertension    No past surgical history on file. Social History   Socioeconomic History  . Marital status: Married    Spouse name: Not on file  . Number of children: Not on file  . Years of education: Not on file  . Highest education level: Not on file  Occupational History  . Not on file  Tobacco Use  . Smoking status: Former Smoker    Packs/day: 0.50    Years: 30.00    Pack years: 15.00  Types: Cigarettes  . Smokeless tobacco: Former Clinical biochemist  . Vaping Use: Every day  Substance and Sexual Activity  . Alcohol use: No    Comment: Abstaining from alcohol for 15 years, prior history in age 54s  . Drug use: No  . Sexual activity: Not on file  Other Topics Concern  . Not on file  Social History Narrative  . Not on file   Social Determinants of Health   Financial Resource Strain:   . Difficulty of Paying Living Expenses:   Food Insecurity:   . Worried About Programme researcher, broadcasting/film/video in the Last Year:   . Barista in the Last Year:   Transportation Needs:   . Freight forwarder (Medical):   Marland Kitchen Lack of Transportation (Non-Medical):    Physical Activity:   . Days of Exercise per Week:   . Minutes of Exercise per Session:   Stress:   . Feeling of Stress :   Social Connections:   . Frequency of Communication with Friends and Family:   . Frequency of Social Gatherings with Friends and Family:   . Attends Religious Services:   . Active Member of Clubs or Organizations:   . Attends Banker Meetings:   Marland Kitchen Marital Status:   Intimate Partner Violence:   . Fear of Current or Ex-Partner:   . Emotionally Abused:   Marland Kitchen Physically Abused:   . Sexually Abused:    Family History  Problem Relation Age of Onset  . Diabetes Mother   . Hypertension Sister    Current Outpatient Medications on File Prior to Visit  Medication Sig  . amLODipine (NORVASC) 10 MG tablet Take 1 tablet (10 mg total) by mouth daily.  . fluticasone (FLONASE) 50 MCG/ACT nasal spray Place 2 sprays into both nostrils daily.  Marland Kitchen omeprazole (PRILOSEC) 20 MG capsule TAKE 1 CAPSULE BY MOUTH  DAILY  . sertraline (ZOLOFT) 50 MG tablet Take 1 tablet (50 mg total) by mouth daily with breakfast.  . SUMAtriptan (IMITREX) 25 MG tablet Take 1-2 tablets (25-50 mg total) by mouth once as needed for up to 1 dose for migraine. May repeat dose in 2 hr if HA persists = max 24 hr   No current facility-administered medications on file prior to visit.    Review of Systems  Constitutional: Negative for activity change, appetite change, chills, diaphoresis, fatigue and fever.  HENT: Negative for congestion and hearing loss.   Eyes: Negative for visual disturbance.  Respiratory: Negative for apnea, cough, chest tightness, shortness of breath and wheezing.   Cardiovascular: Negative for chest pain, palpitations and leg swelling.  Gastrointestinal: Negative for abdominal pain, anal bleeding, blood in stool, constipation, diarrhea, nausea and vomiting.  Endocrine: Negative for cold intolerance.  Genitourinary: Negative for decreased urine volume, difficulty urinating,  dysuria, frequency and hematuria.  Musculoskeletal: Negative for arthralgias, back pain and neck pain.  Skin: Negative for rash.  Allergic/Immunologic: Negative for environmental allergies.  Neurological: Negative for dizziness, weakness, light-headedness, numbness and headaches.  Hematological: Negative for adenopathy.  Psychiatric/Behavioral: Negative for behavioral problems, dysphoric mood and sleep disturbance. The patient is not nervous/anxious.    Per HPI unless specifically indicated above      Objective:    BP 133/77   Pulse 84   Temp (!) 97.5 F (36.4 C) (Temporal)   Resp 16   Ht 5\' 11"  (1.803 m)   Wt 234 lb (106.1 kg)   SpO2 98%   BMI 32.64  kg/m   Wt Readings from Last 3 Encounters:  11/26/19 234 lb (106.1 kg)  10/29/19 235 lb (106.6 kg)  01/15/18 255 lb (115.7 kg)    Physical Exam Vitals and nursing note reviewed.  Constitutional:      General: He is not in acute distress.    Appearance: He is well-developed. He is obese. He is not diaphoretic.     Comments: Well-appearing, comfortable, cooperative  HENT:     Head: Normocephalic and atraumatic.  Eyes:     General:        Right eye: No discharge.        Left eye: No discharge.     Conjunctiva/sclera: Conjunctivae normal.     Pupils: Pupils are equal, round, and reactive to light.  Neck:     Thyroid: No thyromegaly.  Cardiovascular:     Rate and Rhythm: Normal rate and regular rhythm.     Heart sounds: Normal heart sounds. No murmur heard.   Pulmonary:     Effort: Pulmonary effort is normal. No respiratory distress.     Breath sounds: Normal breath sounds. No wheezing or rales.  Abdominal:     General: Bowel sounds are normal. There is no distension.     Palpations: Abdomen is soft. There is no mass.     Tenderness: There is no abdominal tenderness.  Musculoskeletal:        General: No tenderness. Normal range of motion.     Cervical back: Normal range of motion and neck supple.     Comments: Upper  / Lower Extremities: - Normal muscle tone, strength bilateral upper extremities 5/5, lower extremities 5/5  Lymphadenopathy:     Cervical: No cervical adenopathy.  Skin:    General: Skin is warm and dry.     Findings: No erythema or rash.  Neurological:     Mental Status: He is alert and oriented to person, place, and time.     Comments: Distal sensation intact to light touch all extremities  Psychiatric:        Behavior: Behavior normal.     Comments: Well groomed, good eye contact, normal speech and thoughts           Assessment & Plan:   Problem List Items Addressed This Visit    Obesity (BMI 30.0-34.9)    Weight stable in 1 month, down 1 lb Encourage lifestyle diet exercise      Relevant Orders   COMPLETE METABOLIC PANEL WITH GFR   Lipid panel   Major depression, recurrent, full remission (HCC)    Chronic depression, recurrent chronic - now in remission on Sertraline Failed Fluoxetine due to side effect migraine Ineffective / off Wellbutrin Associated secondary insomnia  Plan Continue sertraline 50mg  daily Will offer Trazodone 50mg  nightly for maintenance for sleep if interested, notify office  - If not improving still we can consider referral to psychiatry or behavioral health if indicated or other meds such as Trazodone or other mood medication      Relevant Medications   sertraline (ZOLOFT) 50 MG tablet   Other Relevant Orders   COMPLETE METABOLIC PANEL WITH GFR   Hypertension    Improved HTN control - No known complication - Allergy to ASA 81 (NSAIDs)  Plan: 1. Continue Amlodipine 10mg  daily 2. Keep reducing energy drinks, caffeine, improve hydration 3. Improve regular exercise 4. Monitor home BP readings      Relevant Orders   CBC with Differential/Platelet   COMPLETE METABOLIC PANEL WITH GFR  Hyperlipidemia    Repeat lipid panel, fasting       Relevant Orders   COMPLETE METABOLIC PANEL WITH GFR   Lipid panel   TSH   Hidradenitis  suppurativa    Chronic problem, mostly perineal / groin region In past improve on antibiotic Keflex vs Doxy Home cleansing treatment  Handout today on bleach bath option, for prevention Future may need topical antibiotic mupirocin and or gen surgery consult      Elevated hemoglobin A1c    Very concerned at risk of T2DM Last A1c 7.4 (2018) - then patient lost to follow-up Recent lab suspect inaccurate 11/21/19 - awaiting repeat lab.  Plan Discussion on management for hyperglycemia, if indicated if new dx T2DM will offer start Metformin titration 500 twice a day up to 1000 BID future consider GLP1 therapy      Relevant Orders   Hemoglobin A1c    Other Visit Diagnoses    Annual physical exam    -  Primary   Relevant Orders   Hemoglobin A1c   CBC with Differential/Platelet   COMPLETE METABOLIC PANEL WITH GFR   Lipid panel   TSH       Updated Health Maintenance information Encouraged improvement to lifestyle with diet and exercise - Goal of weight loss   No orders of the defined types were placed in this encounter.  Orders Placed This Encounter  Procedures  . Hemoglobin A1c    Standing Status:   Future    Standing Expiration Date:   04/16/2020  . CBC with Differential/Platelet    Standing Status:   Future    Standing Expiration Date:   04/16/2020  . COMPLETE METABOLIC PANEL WITH GFR    Standing Status:   Future    Standing Expiration Date:   04/16/2020  . Lipid panel    Standing Status:   Future    Standing Expiration Date:   04/16/2020    Order Specific Question:   Has the patient fasted?    Answer:   Yes  . TSH    Standing Status:   Future    Standing Expiration Date:   04/16/2020       Follow up plan: Return in about 3 months (around 02/26/2020) for 3 month Elevated A1c, follow-up hidraadenitis skin boil.   Future orders in repeat all labs physical, all labs from 11/21/19 will be voided and need repeat.  Saralyn PilarAlexander Shaneil Yazdi, DO [facility] 11/26/2019, 1:58 PM

## 2019-11-26 NOTE — Assessment & Plan Note (Signed)
Chronic problem, mostly perineal / groin region In past improve on antibiotic Keflex vs Doxy Home cleansing treatment  Handout today on bleach bath option, for prevention Future may need topical antibiotic mupirocin and or gen surgery consult

## 2019-11-26 NOTE — Assessment & Plan Note (Signed)
Weight stable in 1 month, down 1 lb Encourage lifestyle diet exercise

## 2019-11-26 NOTE — Patient Instructions (Addendum)
Thank you for coming to the office today.  STay tuned for lab results.  DUE for FASTING BLOOD WORK (no food or drink after midnight before the lab appointment, only water or coffee without cream/sugar on the morning of)  SCHEDULE "Lab Only" visit in the morning at the clinic for lab draw in 1 WEEK  For Lab Results, once available within 2-3 days of blood draw, you can can log in to MyChart online to view your results and a brief explanation. Also, we can discuss results at next follow-up visit.   For skin sores, recurrent.  Bleach Bath To decrease bacterial infection and reduce symptoms, bleach baths are sometimes recommended. Add  -  cup of common household bleach (wear gloves and eye protection and avoid any skin contact) to a bathtub full of water (mix the tub before getting in). Soak your torso or just the affected part of your skin for about 10 minutes. Limit this type of bleach baths to no more than twice a week. If this causes any significant symptoms of skin irritation or other symptoms, please get out of the tub immediately and rinse the affected area off.  ---------------------------------  Keep on current Zoloft 50mg  daily.  Next option is Trazodone 50mg  nightly - it will work with the zoloft. And can help maintain sleep. Let me know- message or call if you want to add this one in the future.  Sleep Hygiene Recommendations to promote healthy sleep in all patients, especially if symptoms of insomnia are worsening. Due to the nature of sleep rhythms, if your body gets "out of rhythm", it may take some time before your sleep cycle can be "reset".  Please try to follow as many of the following tips as you can, usually there are only a few of these are the primary cause of the problem.  ?To reset your sleep rhythm, go to bed and get up at the same time every day ?Sleep only long enough to feel rested and then get out of bed ?Do not try to force yourself to sleep. If you can't  sleep, get out of bed and try again later. ?Avoid naps during the day, unless excessively tired. The more sleeping during the day, then the less sleep your body needs at night.  ?Have coffee, tea, and other foods that have caffeine only in the morning ?Exercise several days a week, but not right before bed ?If you drink alcohol, prefer to have appropriate drink with one meal, but prefer to avoid alcohol in the evening, and bedtime ?If you smoke, avoid smoking, especially in the evening  ?Avoid watching TV or looking at phones, computers, or reading devices ("e-books") that give off light at least 30 minutes before bed. This artificial light sends "awake signals" to your brain and can make it harder to fall asleep. ?Make your bedroom a comfortable place where it is easy to fall asleep: ? Put up shades or special blackout curtains to block light from outside. ? Use a white noise machine to block noise. ? Keep the temperature cool. ?Try your best to solve or at least address your problems before you go to bed ?Use relaxation techniques to manage stress. Ask your health care provider to suggest some techniques that may work well for you. These may include: ? Breathing exercises. ? Routines to release muscle tension. ? Visualizing peaceful scenes.   Please schedule a Follow-up Appointment to: Return in about 3 months (around 02/26/2020) for 3 month Elevated A1c, follow-up  hidraadenitis skin boil.  If you have any other questions or concerns, please feel free to call the office or send a message through MyChart. You may also schedule an earlier appointment if necessary.  Additionally, you may be receiving a survey about your experience at our office within a few days to 1 week by e-mail or mail. We value your feedback.  Saralyn Pilar, DO [facility], [place]

## 2019-11-27 LAB — LIPID PANEL
Cholesterol: 220 mg/dL — ABNORMAL HIGH (ref <200)
HDL: 34 mg/dL — ABNORMAL LOW (ref >=40)
Triglycerides: 381 mg/dL — ABNORMAL HIGH (ref <150)

## 2019-11-27 LAB — HEMOGLOBIN A1C

## 2019-11-27 LAB — COMPLETE METABOLIC PANEL WITH GFR
ALT: 61 U/L — ABNORMAL HIGH (ref 9–46)
AST: 30 U/L (ref 10–40)
Alkaline phosphatase (APISO): 94 U/L (ref 36–130)
Total Bilirubin: 0.9 mg/dL (ref 0.2–1.2)

## 2019-11-27 LAB — TSH

## 2019-11-27 LAB — CBC WITH DIFFERENTIAL/PLATELET

## 2019-12-01 ENCOUNTER — Other Ambulatory Visit: Payer: Self-pay | Admitting: Family Medicine

## 2019-12-01 DIAGNOSIS — F3342 Major depressive disorder, recurrent, in full remission: Secondary | ICD-10-CM

## 2019-12-03 ENCOUNTER — Other Ambulatory Visit: Payer: Self-pay

## 2019-12-03 DIAGNOSIS — I10 Essential (primary) hypertension: Secondary | ICD-10-CM

## 2019-12-03 DIAGNOSIS — R7309 Other abnormal glucose: Secondary | ICD-10-CM

## 2019-12-03 DIAGNOSIS — F3342 Major depressive disorder, recurrent, in full remission: Secondary | ICD-10-CM

## 2019-12-03 DIAGNOSIS — E782 Mixed hyperlipidemia: Secondary | ICD-10-CM

## 2019-12-03 DIAGNOSIS — E669 Obesity, unspecified: Secondary | ICD-10-CM

## 2019-12-03 DIAGNOSIS — Z0000 Encounter for general adult medical examination without abnormal findings: Secondary | ICD-10-CM

## 2019-12-05 ENCOUNTER — Other Ambulatory Visit: Payer: Self-pay | Admitting: Family Medicine

## 2019-12-05 ENCOUNTER — Other Ambulatory Visit: Payer: Self-pay

## 2019-12-05 ENCOUNTER — Other Ambulatory Visit: Payer: 59

## 2019-12-06 LAB — COMPLETE METABOLIC PANEL WITH GFR
AG Ratio: 1.7 (calc) (ref 1.0–2.5)
ALT: 45 U/L (ref 9–46)
AST: 24 U/L (ref 10–40)
Albumin: 4.6 g/dL (ref 3.6–5.1)
Alkaline phosphatase (APISO): 88 U/L (ref 36–130)
BUN: 8 mg/dL (ref 7–25)
CO2: 27 mmol/L (ref 20–32)
Calcium: 10.1 mg/dL (ref 8.6–10.3)
Chloride: 96 mmol/L — ABNORMAL LOW (ref 98–110)
Creat: 0.79 mg/dL (ref 0.60–1.35)
GFR, Est African American: 129 mL/min/{1.73_m2} (ref >=60)
GFR, Est Non African American: 112 mL/min/{1.73_m2} (ref >=60)
Globulin: 2.7 g/dL (calc) (ref 1.9–3.7)
Glucose, Bld: 397 mg/dL — ABNORMAL HIGH (ref 65–99)
Potassium: 4.1 mmol/L (ref 3.5–5.3)
Sodium: 133 mmol/L — ABNORMAL LOW (ref 135–146)
Total Bilirubin: 1.3 mg/dL — ABNORMAL HIGH (ref 0.2–1.2)
Total Protein: 7.3 g/dL (ref 6.1–8.1)

## 2019-12-06 LAB — CBC WITH DIFFERENTIAL/PLATELET
Absolute Monocytes: 744 cells/uL (ref 200–950)
Basophils Absolute: 88 cells/uL (ref 0–200)
Basophils Relative: 1.1 %
Eosinophils Absolute: 200 cells/uL (ref 15–500)
Eosinophils Relative: 2.5 %
HCT: 47.6 % (ref 38.5–50.0)
Hemoglobin: 15.9 g/dL (ref 13.2–17.1)
Lymphs Abs: 2920 cells/uL (ref 850–3900)
MCH: 29.2 pg (ref 27.0–33.0)
MCHC: 33.4 g/dL (ref 32.0–36.0)
MCV: 87.3 fL (ref 80.0–100.0)
MPV: 11.5 fL (ref 7.5–12.5)
Monocytes Relative: 9.3 %
Neutro Abs: 4048 cells/uL (ref 1500–7800)
Neutrophils Relative %: 50.6 %
Platelets: 223 10*3/uL (ref 140–400)
RBC: 5.45 10*6/uL (ref 4.20–5.80)
RDW: 12.5 % (ref 11.0–15.0)
Total Lymphocyte: 36.5 %
WBC: 8 10*3/uL (ref 3.8–10.8)

## 2019-12-06 LAB — TSH: TSH: 4.31 mIU/L (ref 0.40–4.50)

## 2019-12-06 LAB — LIPID PANEL
Cholesterol: 237 mg/dL — ABNORMAL HIGH (ref <200)
HDL: 31 mg/dL — ABNORMAL LOW (ref >=40)
LDL Cholesterol (Calc): 156 mg/dL (calc) — ABNORMAL HIGH
Non-HDL Cholesterol (Calc): 206 mg/dL (calc) — ABNORMAL HIGH (ref <130)
Total CHOL/HDL Ratio: 7.6 (calc) — ABNORMAL HIGH (ref <5.0)
Triglycerides: 325 mg/dL — ABNORMAL HIGH (ref <150)

## 2019-12-06 LAB — HEMOGLOBIN A1C: Hgb A1c MFr Bld: >14 % of total Hgb — ABNORMAL HIGH (ref <5.7)

## 2019-12-07 ENCOUNTER — Encounter: Payer: Self-pay | Admitting: Family Medicine

## 2019-12-13 ENCOUNTER — Ambulatory Visit (INDEPENDENT_AMBULATORY_CARE_PROVIDER_SITE_OTHER): Payer: 59 | Admitting: Family Medicine

## 2019-12-13 ENCOUNTER — Other Ambulatory Visit: Payer: Self-pay

## 2019-12-13 ENCOUNTER — Encounter: Payer: Self-pay | Admitting: Family Medicine

## 2019-12-13 VITALS — BP 126/75 | HR 94 | Temp 97.7°F | Resp 16 | Ht 71.0 in | Wt 233.6 lb

## 2019-12-13 DIAGNOSIS — E785 Hyperlipidemia, unspecified: Secondary | ICD-10-CM

## 2019-12-13 DIAGNOSIS — F3342 Major depressive disorder, recurrent, in full remission: Secondary | ICD-10-CM

## 2019-12-13 DIAGNOSIS — E1165 Type 2 diabetes mellitus with hyperglycemia: Secondary | ICD-10-CM

## 2019-12-13 DIAGNOSIS — E1169 Type 2 diabetes mellitus with other specified complication: Secondary | ICD-10-CM | POA: Diagnosis not present

## 2019-12-13 DIAGNOSIS — F5101 Primary insomnia: Secondary | ICD-10-CM

## 2019-12-13 DIAGNOSIS — L732 Hidradenitis suppurativa: Secondary | ICD-10-CM

## 2019-12-13 DIAGNOSIS — E669 Obesity, unspecified: Secondary | ICD-10-CM | POA: Diagnosis not present

## 2019-12-13 LAB — POCT UA - MICROALBUMIN: Microalbumin Ur, POC: 0 mg/L

## 2019-12-13 MED ORDER — CEPHALEXIN 500 MG PO CAPS: 500.0000 mg | ORAL_CAPSULE | Freq: Three times a day (TID) | ORAL | 0 refills | Status: DC

## 2019-12-13 MED ORDER — ONETOUCH ULTRA CONTROL VI SOLN: 0 refills | Status: AC

## 2019-12-13 MED ORDER — ONETOUCH ULTRA 2 W/DEVICE KIT: PACK | 0 refills | Status: AC

## 2019-12-13 MED ORDER — TRAZODONE HCL 50 MG PO TABS: 50.0000 mg | ORAL_TABLET | Freq: Every day | ORAL | 1 refills | Status: DC

## 2019-12-13 MED ORDER — ONETOUCH ULTRASOFT LANCETS MISC: 12 refills | Status: AC

## 2019-12-13 MED ORDER — ONETOUCH ULTRA VI STRP: ORAL_STRIP | 12 refills | Status: AC

## 2019-12-13 MED ORDER — METFORMIN HCL 500 MG PO TABS: 1000.0000 mg | ORAL_TABLET | Freq: Two times a day (BID) | ORAL | 1 refills | Status: DC

## 2019-12-13 MED ORDER — ROSUVASTATIN CALCIUM 20 MG PO TABS: 20.0000 mg | ORAL_TABLET | Freq: Every day | ORAL | 3 refills | Status: DC

## 2019-12-13 NOTE — Assessment & Plan Note (Signed)
Persistent problem See A&P  Plan Continue sertraline 50mg  daily ADD Trazodone 50mg  nightly for maintenance for sleep  - If not improving still we can consider referral to psychiatry or behavioral health if indicated

## 2019-12-13 NOTE — Assessment & Plan Note (Signed)
Chronic depression, recurrent chronic - now in remission on Sertraline Failed Fluoxetine due to side effect migraine Ineffective / off Wellbutrin Associated secondary insomnia  Plan Continue sertraline 50mg  daily ADD Trazodone 50mg  nightly for maintenance for sleep  - If not improving still we can consider referral to psychiatry or behavioral health if indicated

## 2019-12-13 NOTE — Assessment & Plan Note (Signed)
New diagnosis Severely Uncontrolled DM with hyperglycemia, A1c >14, confirmed repeat lab Complications - hyperlipidemia, depression, obesity, hyperglycemia - increases risk of future cardiovascular complications    Plan:  1. START Metformin IR 1000mg  BID - using 500mg  tabs, start titrate from 500 daily with meal, titrate up every few days to week, by 1 additional pill to max dose 500mg  x 2 = 1000mg  BID Encourage improved lifestyle - low carb, low sugar diet, reduce portion size, continue improving regular exercise 3. Check CBG, bring log to next visit for review  - handout given, new glucometer One Touch Ultra 2 ordered to local pharmacy, check up to 2 times daily 4. Check urine microalbumin today, not on ACEi / ARB 5. Start Statin 6. DM Foot exam done today / handout given - Advised to schedule DM ophtho exam, send record  F/u 3 months, consider Rybelsus GLP1 oral option, he prefers to avoid injection. He can check cost coverage, handout given.

## 2019-12-13 NOTE — Progress Notes (Signed)
Subjective:    Patient ID: Curtis Parrish, male    DOB: Jan 28, 1981, 39 y.o.   MRN: [id number]  Curtis Parrish is a 39 y.o. male presenting on 12/13/2019 for Diabetes   HPI    TYPE 2 DIABETES (New diagnosis) Hyperglycemia Hyperlipidemia / Obesity BMI >32 Lab result A1c >14 confirmed on repeat. He admits poor lifestyle, goal to improve CBGs: None, needs glucometer Meds: None Not on ACEi/ARB Denies hypoglycemia, polyuria, visual changes, numbness or tingling.   Major Recurrent depression, in remission Insomnia He is doing better back on Sertraline prefers 98m daily dose, has not increased, admits in past on sertraline or similar med he has had mild tremor of hands and zones out at times. - He admits tossing and turning, difficult falling asleep with insomnia. Off wellbutrin - he has not tried melatonin recently, interested in sleep hygiene - He failed Fluoxetine due to Migraine side effect in the past - He admits insomnia, difficulty with sleep onset initiation - asking about Trazodone today still difficulty with sleep Denies suicidal or homicidal ideation, or worsening anxiety  Hidraadenitis Chronic Previous history axillary, now mostly groin / testicular In past with skin bump boils, he has benefited from keflex antibiotic or other, he has done self drainage and maintenance, cleaning using alcohol, warm water soaks - Tried bleach bath did not prevent Still has recent flare, drained recently.     [facility] 12/13/2019 11/26/2019 10/29/2019  Decreased Interest 0 0 3  Down, Depressed, Hopeless 0 0 2  PHQ - 2 Score 0 0 5  Altered sleeping 0 0 3  Tired, decreased energy 0 0 1  Change in appetite 0 0 2  Feeling bad or failure about yourself  0 0 2  Trouble concentrating 0 0 2  Moving slowly or fidgety/restless 0 0 1  Suicidal thoughts 0 0 0  PHQ-9 Score 0 0 16  Difficult doing work/chores Not difficult at all Not difficult at all Somewhat difficult     Social History   Tobacco Use  . Smoking status: Former Smoker    Packs/day: 0.50    Years: 30.00    Pack years: 15.00    Types: Cigarettes  . 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 Vaping Use: Every day  Substance Use Topics  . Alcohol use: No    Comment: Abstaining from alcohol for 15 years, prior history in age 7345s . Drug use: No    Review of Systems Per HPI unless specifically indicated above     Objective:    BP 126/75   Pulse 94   Temp 97.7 F (36.5 C) (Temporal)   Resp 16   Ht _0  (1.803 m)   Wt (!) 233 lb 9.6 oz (106 kg)   SpO2 99%   BMI 32.58 kg/m   Wt Readings from Last 3 Encounters:  12/13/19 (!) 233 lb 9.6 oz (106 kg)  11/26/19 234 lb (106.1 kg)  10/29/19 235 lb (106.6 kg)    Physical Exam Vitals and nursing note reviewed.  Constitutional:      General: He is not in acute distress.    Appearance: He is well-developed. He is obese. He is not diaphoretic.     Comments: Well-appearing, comfortable, cooperative  HENT:     Head: Normocephalic and atraumatic.  Eyes:     General:        Right eye: No discharge.        Left eye: No  discharge.     Conjunctiva/sclera: Conjunctivae normal.  Neck:     Thyroid: No thyromegaly.  Cardiovascular:     Rate and Rhythm: Normal rate and regular rhythm.     Heart sounds: Normal heart sounds. No murmur heard.   Pulmonary:     Effort: Pulmonary effort is normal. No respiratory distress.     Breath sounds: Normal breath sounds. No wheezing or rales.  Musculoskeletal:        General: Normal range of motion.     Cervical back: Normal range of motion and neck supple.  Lymphadenopathy:     Cervical: No cervical adenopathy.  Skin:    General: Skin is warm and dry.     Findings: No erythema or rash.  Neurological:     Mental Status: He is alert and oriented to person, place, and time.  Psychiatric:        Behavior: Behavior normal.     Comments: Well groomed, good eye contact, normal speech and  thoughts      Diabetic Foot Exam - Simple   Simple Foot Form Diabetic Foot exam was performed with the following findings: Yes 12/13/2019 10:30 AM  Visual Inspection See comments: Yes Sensation Testing Intact to touch and monofilament testing bilaterally: Yes Pulse Check Posterior Tibialis and Dorsalis pulse intact bilaterally: Yes Comments Bilateral diffuse callus formation dry skin. No ulceration. Intact monofilament.    Recent Labs    11/21/19 0930 12/05/19 0848  HGBA1C TNP/1523 >14.0*   Lipid Panel     Component Value Date/Time   CHOL 237 (H) 12/05/2019 0848   CHOL 187 09/16/2016 1030   TRIG 325 (H) 12/05/2019 0848   HDL 31 (L) 12/05/2019 0848   HDL 31 (L) 09/16/2016 1030   CHOLHDL 7.6 (H) 12/05/2019 0848   LDLCALC 156 (H) 12/05/2019 0848   LABVLDL 32 09/16/2016 1030     Chemistry      Component Value Date/Time   NA 133 (L) 12/05/2019 0848   NA 138 09/16/2016 1030   K 4.1 12/05/2019 0848   CL 96 (L) 12/05/2019 0848   CO2 27 12/05/2019 0848   BUN 8 12/05/2019 0848   BUN 9 09/16/2016 1030   CREATININE 0.79 12/05/2019 0848      Component Value Date/Time   CALCIUM 10.1 12/05/2019 0848   ALKPHOS 82 09/16/2016 1030   AST 24 12/05/2019 0848   ALT 45 12/05/2019 0848   BILITOT 1.3 (H) 12/05/2019 0848   BILITOT 0.3 09/16/2016 1030       Results for orders placed or performed in visit on 12/13/19  POCT UA - Microalbumin  Result Value Ref Range   Microalbumin Ur, POC 0 mg/L      Assessment & Plan:   Problem List Items Addressed This Visit    Type 2 diabetes mellitus with hyperglycemia ([place]) - Primary    New diagnosis Severely Uncontrolled DM with hyperglycemia, A1c >14, confirmed repeat lab Complications - hyperlipidemia, depression, obesity, hyperglycemia - increases risk of future cardiovascular complications    Plan:  1. START Metformin IR '1000mg'$  BID - using '500mg'$  tabs, start titrate from 500 daily with meal, titrate up every few days to week, by 1  additional pill to max dose '500mg'$  x 2 = '1000mg'$  BID Encourage improved lifestyle - low carb, low sugar diet, reduce portion size, continue improving regular exercise 3. Check CBG, bring log to next visit for review  - handout given, new glucometer One Touch Ultra 2 ordered to local pharmacy, check  up to 2 times daily 4. Check urine microalbumin today, not on ACEi / ARB 5. Start Statin 6. DM Foot exam done today / handout given - Advised to schedule DM ophtho exam, send record  F/u 3 months, consider Rybelsus GLP1 oral option, he prefers to avoid injection. He can check cost coverage, handout given.      Relevant Medications   metFORMIN (GLUCOPHAGE) 500 MG tablet   rosuvastatin (CRESTOR) 20 MG tablet   Blood Glucose Monitoring Suppl (ONE TOUCH ULTRA 2) w/Device KIT   Lancets (ONETOUCH ULTRASOFT) lancets   ONETOUCH ULTRA test strip   Blood Glucose Calibration (OT ULTRA/FASTTK CNTRL SOLN) SOLN   Other Relevant Orders   POCT UA - Microalbumin (Completed)   Hemoglobin A1c   COMPLETE METABOLIC PANEL WITH GFR   Primary insomnia    Persistent problem See A&P  Plan Continue sertraline 13m daily ADD Trazodone 549mnightly for maintenance for sleep  - If not improving still we can consider referral to psychiatry or behavioral health if indicated      Relevant Medications   traZODone (DESYREL) 50 MG tablet   Obesity (BMI 30.0-34.9)   Major depression, recurrent, full remission (HCC)    Chronic depression, recurrent chronic - now in remission on Sertraline Failed Fluoxetine due to side effect migraine Ineffective / off Wellbutrin Associated secondary insomnia  Plan Continue sertraline 503maily ADD Trazodone 72m51mghtly for maintenance for sleep  - If not improving still we can consider referral to psychiatry or behavioral health if indicated      Relevant Medications   traZODone (DESYREL) 50 MG tablet   Hyperlipidemia associated with type 2 diabetes mellitus (HCC)Ivey  Uncontrolled cholesterol, LDL 156, not on statin, poor lifestyle Last lipid panel 11/2019 Calculated ASCVD 10 yr risk score elevated  Plan: 1. NEW START Rosuvastatin 20mg79mhtly - new rx 90 day sent. Discussed risk benefit potential side effect 2. Encourage improved lifestyle - low carb/cholesterol, reduce portion size, continue improving regular exercise  Check Lipid, CMET in 3 months approx, review statin dosing.      Relevant Medications   metFORMIN (GLUCOPHAGE) 500 MG tablet   rosuvastatin (CRESTOR) 20 MG tablet   Other Relevant Orders   COMPLETE METABOLIC PANEL WITH GFR   Lipid panel   Hidradenitis suppurativa    Chronic problem, mostly perineal / groin region  Order Keflex for recent flare up.      Relevant Medications   cephALEXin (KEFLEX) 500 MG capsule       Meds ordered this encounter  Medications  . traZODone (DESYREL) 50 MG tablet    Sig: Take 1 tablet (50 mg total) by mouth at bedtime.    Dispense:  90 tablet    Refill:  1  . metFORMIN (GLUCOPHAGE) 500 MG tablet    Sig: Take 2 tablets (1,000 mg total) by mouth 2 (two) times daily with a meal.    Dispense:  360 tablet    Refill:  1  . rosuvastatin (CRESTOR) 20 MG tablet    Sig: Take 1 tablet (20 mg total) by mouth at bedtime.    Dispense:  90 tablet    Refill:  3  . cephALEXin (KEFLEX) 500 MG capsule    Sig: Take 1 capsule (500 mg total) by mouth 3 (three) times daily. For 7 days    Dispense:  21 capsule    Refill:  0  . Blood Glucose Monitoring Suppl (ONE TOUCH ULTRA 2) w/Device KIT  Sig: Use to check blood sugar twice a day    Dispense:  1 kit    Refill:  0    E11.65, OneTouch Ultra 2  . Lancets (ONETOUCH ULTRASOFT) lancets    Sig: Use to check blood sugar twice a day    Dispense:  200 each    Refill:  12    E11.65, OneTouch Ultra 2  . ONETOUCH ULTRA test strip    Sig: Use to check blood sugar twice a day    Dispense:  200 each    Refill:  12    E11.65, OneTouch Ultra 2  . Blood Glucose  Calibration (OT ULTRA/FASTTK CNTRL SOLN) SOLN    Sig: Use to check blood sugar twice a day    Dispense:  1 each    Refill:  0    E11.65, OneTouch Ultra 2     Follow up plan: Return in about 3 months (around 03/14/2020) for Fasting lab in 3 months then re-schedule apt now to 3 months from today DM.   Nobie Putnam, [facility] 12/13/2019, 10:06 AM

## 2019-12-13 NOTE — Patient Instructions (Addendum)
Thank you for coming to the office today.  For sleep, add Trazodone 50mg  nightly.  Metformin IR 500mg  pills - start with one with dinner with meal. For few days to a week, if not upseting stomach, then can add one with breakfast. Eventually will be max dose 2 pills twice a day with meal (500mg  each = 1000mg  per dose, = 2000mg  in 24 hours)  We will order a new Glucometer to test sugar, first thing in morning fasting every day, write down, goal, consistently < 180-200. Then next goals are going to be < 150 fasting.  Call insurance find cost and coverage of the following   Rybelsus (pill - oral semaglutide or ozempic) - once daily, taken first thing in morning without other meds Trulicity (injection once week  Ozempic (injection once week Other option  We can get these newer meds at low cost if you are interested.  3 benefits - 1 significantly reduced A1c sugar, and may be able to reduce or stop metformin in future - 2 reduced appetite and weight loss with good results - 3 cardiovascular risk reduction, less likely to have heart attack/stroke  --------------------------------------------------  Your provider would like to you have your annual eye exam. Please contact your current eye doctor or here are some good options for you to contact.   [facility]   Address: [address] [place],  Phone: [phone]  Website: [email or web address]   [facility] [address], [place], [address] [phone] Phone: [phone] [email or web address]  [facility]  Address: [address] [place], [place], [phone] [facility] Phone: [phone]   [facility] [address] [place], [address] [phone] [facility] Phone: [phone]  [facility] Address: [address] [place], [place], [phone] [facility]  Phone: [phone]   Please schedule a Follow-up Appointment to: Return in about 3 months (around 03/14/2020) for Fasting lab in 3 months then re-schedule apt now to  3 months from today DM.  If you have any other questions or concerns, please feel free to call the office or send a message through MyChart. You may also schedule an earlier appointment if necessary.  Additionally, you may be receiving a survey about your experience at our office within a few days to 1 week by e-mail or mail. We value your feedback.  [place], DO [facility], [address]

## 2019-12-13 NOTE — Assessment & Plan Note (Signed)
Chronic problem, mostly perineal / groin region  Order Keflex for recent flare up.

## 2019-12-13 NOTE — Assessment & Plan Note (Addendum)
Uncontrolled cholesterol, LDL 156, not on statin, poor lifestyle Last lipid panel 11/2019 Calculated ASCVD 10 yr risk score elevated  Plan: 1. NEW START Rosuvastatin 20mg  nightly - new rx 90 day sent. Discussed risk benefit potential side effect 2. Encourage improved lifestyle - low carb/cholesterol, reduce portion size, continue improving regular exercise  Check Lipid, CMET in 3 months approx, review statin dosing.

## 2019-12-21 ENCOUNTER — Other Ambulatory Visit: Payer: Self-pay | Admitting: Family Medicine

## 2020-01-01 ENCOUNTER — Other Ambulatory Visit: Payer: Self-pay | Admitting: Family Medicine

## 2020-01-01 DIAGNOSIS — R1013 Epigastric pain: Secondary | ICD-10-CM

## 2020-01-01 NOTE — Telephone Encounter (Signed)
Requested Prescriptions  Pending Prescriptions Disp Refills   omeprazole (PRILOSEC) 20 MG capsule [Pharmacy Med Name: OMEPRAZOLE  20MG   CAP] 90 capsule 3    Sig: TAKE 1 CAPSULE BY MOUTH  DAILY     Gastroenterology: Proton Pump Inhibitors Passed - 01/01/2020 10:54 PM      Passed - Valid encounter within last 12 months    Recent Outpatient Visits          2 weeks ago Type 2 diabetes mellitus with hyperglycemia, without long-term current use of insulin [facility])   [facility], [facility], DO   1 month ago Annual physical exam   [facility], DO   2 months ago Essential hypertension   [facility] [place], DO   1 year ago Moderate recurrent major depression [facility])   [facility], DO   2 years ago Other migraine without status migrainosus, intractable   [facility] [place] [place], DO      Future Appointments            In 2 months Netta Neat, Althea Charon, DO [facility], [facility]

## 2020-02-03 DIAGNOSIS — F3342 Major depressive disorder, recurrent, in full remission: Secondary | ICD-10-CM

## 2020-02-03 DIAGNOSIS — L732 Hidradenitis suppurativa: Secondary | ICD-10-CM

## 2020-02-04 MED ORDER — SERTRALINE HCL 50 MG PO TABS: 50.0000 mg | ORAL_TABLET | Freq: Every day | ORAL | 3 refills | Status: DC

## 2020-02-04 MED ORDER — CEPHALEXIN 500 MG PO CAPS: 500.0000 mg | ORAL_CAPSULE | Freq: Three times a day (TID) | ORAL | 0 refills | Status: DC

## 2020-02-21 DIAGNOSIS — E1165 Type 2 diabetes mellitus with hyperglycemia: Secondary | ICD-10-CM

## 2020-02-21 MED ORDER — TRULICITY 1.5 MG/0.5ML ~~LOC~~ SOAJ: 1.5000 mg | SUBCUTANEOUS | 2 refills | Status: DC

## 2020-03-03 ENCOUNTER — Ambulatory Visit: Payer: 59 | Admitting: Family Medicine

## 2020-03-07 ENCOUNTER — Other Ambulatory Visit: Payer: Self-pay | Admitting: *Deleted

## 2020-03-07 DIAGNOSIS — E1165 Type 2 diabetes mellitus with hyperglycemia: Secondary | ICD-10-CM

## 2020-03-07 DIAGNOSIS — E1169 Type 2 diabetes mellitus with other specified complication: Secondary | ICD-10-CM

## 2020-03-10 ENCOUNTER — Other Ambulatory Visit: Payer: 59

## 2020-03-11 ENCOUNTER — Other Ambulatory Visit: Payer: Self-pay

## 2020-03-11 LAB — COMPLETE METABOLIC PANEL WITH GFR
AG Ratio: 1.7 (calc) (ref 1.0–2.5)
ALT: 106 U/L — ABNORMAL HIGH (ref 9–46)
AST: 70 U/L — ABNORMAL HIGH (ref 10–40)
Albumin: 4.8 g/dL (ref 3.6–5.1)
Alkaline phosphatase (APISO): 85 U/L (ref 36–130)
BUN: 9 mg/dL (ref 7–25)
CO2: 25 mmol/L (ref 20–32)
Calcium: 10.1 mg/dL (ref 8.6–10.3)
Chloride: 99 mmol/L (ref 98–110)
Creat: 0.8 mg/dL (ref 0.60–1.35)
GFR, Est African American: 129 mL/min/{1.73_m2} (ref >=60)
GFR, Est Non African American: 111 mL/min/{1.73_m2} (ref >=60)
Globulin: 2.9 g/dL (calc) (ref 1.9–3.7)
Glucose, Bld: 248 mg/dL — ABNORMAL HIGH (ref 65–99)
Potassium: 4 mmol/L (ref 3.5–5.3)
Sodium: 136 mmol/L (ref 135–146)
Total Bilirubin: 1.2 mg/dL (ref 0.2–1.2)
Total Protein: 7.7 g/dL (ref 6.1–8.1)

## 2020-03-11 LAB — LIPID PANEL
Cholesterol: 87 mg/dL (ref <200)
HDL: 27 mg/dL — ABNORMAL LOW (ref >=40)
LDL Cholesterol (Calc): 36 mg/dL (calc)
Non-HDL Cholesterol (Calc): 60 mg/dL (calc) (ref <130)
Total CHOL/HDL Ratio: 3.2 (calc) (ref <5.0)
Triglycerides: 153 mg/dL — ABNORMAL HIGH (ref <150)

## 2020-03-11 LAB — HEMOGLOBIN A1C
Hgb A1c MFr Bld: 12.2 % of total Hgb — ABNORMAL HIGH (ref <5.7)
Mean Plasma Glucose: 303 (calc)
eAG (mmol/L): 16.8 (calc)

## 2020-03-17 ENCOUNTER — Ambulatory Visit: Payer: 59 | Admitting: Family Medicine

## 2020-03-19 ENCOUNTER — Ambulatory Visit (INDEPENDENT_AMBULATORY_CARE_PROVIDER_SITE_OTHER): Payer: 59 | Admitting: Family Medicine

## 2020-03-19 ENCOUNTER — Encounter: Payer: Self-pay | Admitting: Family Medicine

## 2020-03-19 ENCOUNTER — Other Ambulatory Visit: Payer: Self-pay

## 2020-03-19 VITALS — BP 135/80 | HR 79 | Temp 97.5°F | Resp 16 | Ht 71.0 in | Wt 238.0 lb

## 2020-03-19 DIAGNOSIS — R7989 Other specified abnormal findings of blood chemistry: Secondary | ICD-10-CM | POA: Diagnosis not present

## 2020-03-19 DIAGNOSIS — E1169 Type 2 diabetes mellitus with other specified complication: Secondary | ICD-10-CM

## 2020-03-19 DIAGNOSIS — F3342 Major depressive disorder, recurrent, in full remission: Secondary | ICD-10-CM | POA: Diagnosis not present

## 2020-03-19 DIAGNOSIS — E1165 Type 2 diabetes mellitus with hyperglycemia: Secondary | ICD-10-CM | POA: Diagnosis not present

## 2020-03-19 DIAGNOSIS — E785 Hyperlipidemia, unspecified: Secondary | ICD-10-CM

## 2020-03-19 MED ORDER — TRULICITY 3 MG/0.5ML ~~LOC~~ SOAJ: 3.0000 mg | SUBCUTANEOUS | 5 refills | Status: DC

## 2020-03-19 NOTE — Assessment & Plan Note (Signed)
Chronic depression, recurrent chronic - now in remission on Sertraline Failed Fluoxetine due to side effect migraine Ineffective / off Wellbutrin Associated secondary insomnia  Plan Continue sertraline 50mg  daily, Trazodone 50mg  nightly - If not improving still we can consider referral to psychiatry or behavioral health if indicated

## 2020-03-19 NOTE — Patient Instructions (Addendum)
Thank you for coming to the office today.  Dose increase Trulicity from 1.5 up to 3.0 mg weekly, you can double up 1.5 doses if you want for 1-2 weeks, new rx sent to [facility].  Mild elevated Liver enzymes from Rosuvastatin likely we can re-check next time and adjst dose if need  Your provider would like to you have your annual eye exam. Please contact your current eye doctor or here are some good options for you to contact.   [facility]   Address: [address] [place], [place] [address] Phone: [phone]  Website: [email or web address]   [facility] [address], [place], [place] [address] Phone: [phone] [email or web address]  [facility]  Address: [address] [place], [place], [place] [address] Phone: [phone]   [facility] [address] [place], [place] [address] Phone: [phone]  [facility] Address: [address] [place], [place], [place] [address]  Phone: [phone]  DUE for FASTING BLOOD WORK (no food or drink after midnight before the lab appointment, only water or coffee without cream/sugar on the morning of)  - Make sure Lab Only appointment is at about 1 week before your next appointment, so that results will be available  For Lab Results, once available within 2-3 days of blood draw, you can can log in to MyChart online to view your results and a brief explanation. Also, we can discuss results at next follow-up visit.    Please schedule a Follow-up Appointment to: Return in about 4 months (around 07/17/2020) for 4 month fasting lab only then 1 week later Follow-up DM, LFT (on statin).  If you have any other questions or concerns, please feel free to call the office or send a message through MyChart. You may also schedule an earlier appointment if necessary.  Additionally, you may be receiving a survey about your experience at our office within a few days to 1 week by e-mail or mail. We value your feedback.  Saralyn Pilar, DO [facility], [place]

## 2020-03-19 NOTE — Assessment & Plan Note (Signed)
Improved in interval on GLP1 higher dose Uncontrolled DM with hyperglycemia, A1c >12.2 Complications - hyperlipidemia, depression, obesity, hyperglycemia - increases risk of future cardiovascular complications    Plan:  1. INCREASE Trulicity from 1.5mg  weekly up to 3.0mg  weekly 2. Continue Metformin IR 1000mg  BID  Encourage improved lifestyle - low carb, low sugar diet, reduce portion size, continue improving regular exercise 3. Check CBG, bring log to next visit for review 4. Yearly microalbumin, not on ACEi ARB 5. Continue statin for now, elevated LFT - see A&P, may adjust dose next time 6. Advised to schedule DM ophtho exam, send record  Future consider SGLT2

## 2020-03-19 NOTE — Assessment & Plan Note (Signed)
Dramatic improved lipids on new statin, with LDL down to 36 from 156, total chol to 87, TG 153 Elevated ASCVD in DM2  Elevated LFTs possibly due to Statin therapy.  Plan For now agree to continue Statin current dose Rosuvastatin 20mg  nightly Repeat CMET for LFTs in 4 months, if still elevated, we can consider reduce dose by half for 10mg 

## 2020-03-19 NOTE — Progress Notes (Signed)
Subjective:    Patient ID: Curtis Parrish, male    DOB: Dec 16, 1980, 39 y.o.   MRN: [id number]  Curtis Parrish is a 39 y.o. male presenting on 03/19/2020 for Diabetes   HPI   TYPE 2 DIABETES (New diagnosis) Hyperglycemia Hyperlipidemia / Obesity BMI >33 Last visit A1c >14, he was dose increased on Trulicity from 0.75 to 1.5 He takes Trulicity injection, feels a little fatigue and down for first few hours He has had CBG avg down to 250-300, previously 400-500 in past. Admits some yellow greasy stool history of gallbladder issues RUQ pain in past few years. No pain at this time. Lab shows A1c 12.2 He is still due for DM Eye exam has to schedule He admits poor lifestyle, goal to improve Meds: Metformin 1000mg  BID, Trulicity 1.5mg  weekly inj Not on ACEi/ARB Denies hypoglycemia, polyuria, visual changes, numbness or tingling.  Elevated LFTs / Hyperlipidemia Recently started on Rosuvastatin 20mg  daily back in 11/2019. Lab shows elevated LFTs, he has had prior mild elevation but now has persistent elevation. Tolerating well without myalgia  Major Recurrent depression,in remission Insomnia Doing well on Sertraline and Trazodone - He failed Fluoxetine due to Migraine side effect in the past Denies suicidal or homicidal ideation, or worsening anxiety  Hidraadenitis Chronic He still has recurrent boils.   Depression screen [facility] 2/9 03/19/2020 12/13/2019 11/26/2019  Decreased Interest 0 0 0  Down, Depressed, Hopeless 0 0 0  PHQ - 2 Score 0 0 0  Altered sleeping 0 0 0  Tired, decreased energy 0 0 0  Change in appetite 0 0 0  Feeling bad or failure about yourself  0 0 0  Trouble concentrating 0 0 0  Moving slowly or fidgety/restless 0 0 0  Suicidal thoughts 0 0 0  PHQ-9 Score 0 0 0  Difficult doing work/chores Not difficult at all Not difficult at all Not difficult at all   GAD 7 : Generalized Anxiety Score 10/29/2019 09/15/2016  Nervous, Anxious, on Edge 2 2  Control/stop worrying 1  1  Worry too much - different things 1 2  Trouble relaxing 2 2  Restless 1 1  Easily annoyed or irritable 1 2  Afraid - awful might happen 1 1  Total GAD 7 Score 9 11  Anxiety Difficulty Somewhat difficult Somewhat difficult      Social History   Tobacco Use   Smoking status: Former Smoker    Packs/day: 0.50    Years: 30.00    Pack years: 15.00    Types: Cigarettes   Smokeless tobacco: Former 10/31/2019 Use: Every day  Substance Use Topics   Alcohol use: No    Comment: Abstaining from alcohol for 15 years, prior history in age 26s   Drug use: No    Review of Systems Per HPI unless specifically indicated above     Objective:    BP 135/80    Pulse 79    Temp (!) 97.5 F (36.4 C) (Temporal)    Resp 16    Ht 5\' 11"  (1.803 m)    Wt 238 lb (108 kg)    SpO2 98%    BMI 33.19 kg/m   Wt Readings from Last 3 Encounters:  03/19/20 238 lb (108 kg)  12/13/19 (!) 233 lb 9.6 oz (106 kg)  11/26/19 234 lb (106.1 kg)    Physical Exam Vitals and nursing note reviewed.  Constitutional:      General: He is not  in acute distress.    Appearance: He is well-developed. He is obese. He is not diaphoretic.     Comments: Well-appearing, comfortable, cooperative  HENT:     Head: Normocephalic and atraumatic.  Eyes:     General:        Right eye: No discharge.        Left eye: No discharge.     Conjunctiva/sclera: Conjunctivae normal.  Neck:     Thyroid: No thyromegaly.  Cardiovascular:     Rate and Rhythm: Normal rate and regular rhythm.     Heart sounds: Normal heart sounds. No murmur heard.   Pulmonary:     Effort: Pulmonary effort is normal. No respiratory distress.     Breath sounds: Normal breath sounds. No wheezing or rales.  Musculoskeletal:        General: Normal range of motion.     Cervical back: Normal range of motion and neck supple.  Lymphadenopathy:     Cervical: No cervical adenopathy.  Skin:    General: Skin is warm and dry.     Findings:  No erythema or rash.  Neurological:     Mental Status: He is alert and oriented to person, place, and time.  Psychiatric:        Behavior: Behavior normal.     Comments: Well groomed, good eye contact, normal speech and thoughts        Results for orders placed or performed in visit on 03/07/20  Lipid panel  Result Value Ref Range   Cholesterol 87 <200 mg/dL   HDL 27 (L) > OR = 40 mg/dL   Triglycerides 096 (H) <150 mg/dL   LDL Cholesterol (Calc) 36 mg/dL (calc)   Total CHOL/HDL Ratio 3.2 <5.0 (calc)   Non-HDL Cholesterol (Calc) 60 <283 mg/dL (calc)  COMPLETE METABOLIC PANEL WITH GFR  Result Value Ref Range   Glucose, Bld 248 (H) 65 - 99 mg/dL   BUN 9 7 - 25 mg/dL   Creat 6.62 9.47 - 6.54 mg/dL   GFR, Est Non African American 111 > OR = 60 mL/min/1.12m2   GFR, Est African American 129 > OR = 60 mL/min/1.55m2   BUN/Creatinine Ratio NOT APPLICABLE 6 - 22 (calc)   Sodium 136 135 - 146 mmol/L   Potassium 4.0 3.5 - 5.3 mmol/L   Chloride 99 98 - 110 mmol/L   CO2 25 20 - 32 mmol/L   Calcium 10.1 8.6 - 10.3 mg/dL   Total Protein 7.7 6.1 - 8.1 g/dL   Albumin 4.8 3.6 - 5.1 g/dL   Globulin 2.9 1.9 - 3.7 g/dL (calc)   AG Ratio 1.7 1.0 - 2.5 (calc)   Total Bilirubin 1.2 0.2 - 1.2 mg/dL   Alkaline phosphatase (APISO) 85 36 - 130 U/L   AST 70 (H) 10 - 40 U/L   ALT 106 (H) 9 - 46 U/L  Hemoglobin A1c  Result Value Ref Range   Hgb A1c MFr Bld 12.2 (H) <5.7 % of total Hgb   Mean Plasma Glucose 303 (calc)   eAG (mmol/L) 16.8 (calc)      Assessment & Plan:   Problem List Items Addressed This Visit    Type 2 diabetes mellitus with hyperglycemia (HCC) - Primary    Improved in interval on GLP1 higher dose Uncontrolled DM with hyperglycemia, A1c >12.2 Complications - hyperlipidemia, depression, obesity, hyperglycemia - increases risk of future cardiovascular complications    Plan:  1. INCREASE Trulicity from 1.5mg  weekly up to 3.0mg  weekly 2.  Continue Metformin IR 1000mg   BID  Encourage improved lifestyle - low carb, low sugar diet, reduce portion size, continue improving regular exercise 3. Check CBG, bring log to next visit for review 4. Yearly microalbumin, not on ACEi ARB 5. Continue statin for now, elevated LFT - see A&P, may adjust dose next time 6. Advised to schedule DM ophtho exam, send record  Future consider SGLT2      Relevant Medications   TRULICITY 3 MG/0.5ML SOPN   Major depression, recurrent, full remission (HCC)    Chronic depression, recurrent chronic - now in remission on Sertraline Failed Fluoxetine due to side effect migraine Ineffective / off Wellbutrin Associated secondary insomnia  Plan Continue sertraline 50mg  daily, Trazodone 50mg  nightly - If not improving still we can consider referral to psychiatry or behavioral health if indicated      Hyperlipidemia associated with type 2 diabetes mellitus (HCC)    Dramatic improved lipids on new statin, with LDL down to 36 from 156, total chol to 87, TG 153 Elevated ASCVD in DM2  Elevated LFTs possibly due to Statin therapy.  Plan For now agree to continue Statin current dose Rosuvastatin 20mg  nightly Repeat CMET for LFTs in 4 months, if still elevated, we can consider reduce dose by half for 10mg       Relevant Medications   TRULICITY 3 MG/0.5ML SOPN      Meds ordered this encounter  Medications   TRULICITY 3 MG/0.5ML SOPN    Sig: Inject 3 mg as directed once a week.    Dispense:  2 mL    Refill:  5    Dose increase from 1.5mg  weekly up to 3.0 mg weekly.    Follow up plan: Return in about 4 months (around 07/17/2020) for 4 month fasting lab only then 1 week later Follow-up DM, LFT (on statin).  Future labs ordered for 07/16/20 CMET, A1c   , DO [facility] 03/19/2020, 10:17 AM

## 2020-05-08 ENCOUNTER — Other Ambulatory Visit: Payer: Self-pay | Admitting: Family Medicine

## 2020-05-08 DIAGNOSIS — F5101 Primary insomnia: Secondary | ICD-10-CM

## 2020-05-08 DIAGNOSIS — E1165 Type 2 diabetes mellitus with hyperglycemia: Secondary | ICD-10-CM

## 2020-05-08 DIAGNOSIS — F3342 Major depressive disorder, recurrent, in full remission: Secondary | ICD-10-CM

## 2020-05-08 NOTE — Telephone Encounter (Signed)
Requested Prescriptions  Pending Prescriptions Disp Refills   traZODone (DESYREL) 50 MG tablet [Pharmacy Med Name: traZODone HCl 50 MG Oral Tablet] 90 tablet 1    Sig: TAKE 1 TABLET BY MOUTH AT  BEDTIME     Psychiatry: Antidepressants - Serotonin Modulator Passed - 05/08/2020 10:42 AM      Passed - Completed PHQ-2 or PHQ-9 in the last 360 days      Passed - Valid encounter within last 6 months    Recent Outpatient Visits          1 month ago Type 2 diabetes mellitus with hyperglycemia, without long-term current use of insulin [facility])   [facility], Netta Neat, DO   4 months ago Type 2 diabetes mellitus with hyperglycemia, without long-term current use of insulin [facility])   [facility] Althea Charon, Netta Neat, DO   5 months ago Annual physical exam   [facility] Smitty Cords, DO   6 months ago Essential hypertension   [facility], Netta Neat, DO   1 year ago Moderate recurrent major depression [facility])   [facility] Althea Charon, Netta Neat, DO      Future Appointments            In 2 months Althea Charon, Netta Neat, DO [facility]

## 2020-07-15 ENCOUNTER — Other Ambulatory Visit: Payer: Self-pay | Admitting: Family Medicine

## 2020-07-15 ENCOUNTER — Other Ambulatory Visit: Payer: Self-pay | Admitting: *Deleted

## 2020-07-15 DIAGNOSIS — E1165 Type 2 diabetes mellitus with hyperglycemia: Secondary | ICD-10-CM

## 2020-07-15 DIAGNOSIS — R7989 Other specified abnormal findings of blood chemistry: Secondary | ICD-10-CM

## 2020-07-16 ENCOUNTER — Other Ambulatory Visit: Payer: 59

## 2020-07-17 LAB — COMPLETE METABOLIC PANEL WITH GFR
AG Ratio: 1.6 (calc) (ref 1.0–2.5)
ALT: 67 U/L — ABNORMAL HIGH (ref 9–46)
AST: 41 U/L — ABNORMAL HIGH (ref 10–40)
Albumin: 4.5 g/dL (ref 3.6–5.1)
Alkaline phosphatase (APISO): 62 U/L (ref 36–130)
BUN: 10 mg/dL (ref 7–25)
CO2: 26 mmol/L (ref 20–32)
Calcium: 9.8 mg/dL (ref 8.6–10.3)
Chloride: 103 mmol/L (ref 98–110)
Creat: 0.83 mg/dL (ref 0.60–1.35)
GFR, Est African American: 127 mL/min/{1.73_m2} (ref >=60)
GFR, Est Non African American: 109 mL/min/{1.73_m2} (ref >=60)
Globulin: 2.8 g/dL (calc) (ref 1.9–3.7)
Glucose, Bld: 191 mg/dL — ABNORMAL HIGH (ref 65–99)
Potassium: 4.1 mmol/L (ref 3.5–5.3)
Sodium: 139 mmol/L (ref 135–146)
Total Bilirubin: 1.1 mg/dL (ref 0.2–1.2)
Total Protein: 7.3 g/dL (ref 6.1–8.1)

## 2020-07-17 LAB — HEMOGLOBIN A1C
Hgb A1c MFr Bld: 9.3 % of total Hgb — ABNORMAL HIGH (ref <5.7)
Mean Plasma Glucose: 220 mg/dL
eAG (mmol/L): 12.2 mmol/L

## 2020-07-21 ENCOUNTER — Other Ambulatory Visit: Payer: Self-pay

## 2020-07-23 ENCOUNTER — Encounter: Payer: Self-pay | Admitting: Family Medicine

## 2020-07-23 ENCOUNTER — Other Ambulatory Visit: Payer: Self-pay

## 2020-07-23 ENCOUNTER — Ambulatory Visit (INDEPENDENT_AMBULATORY_CARE_PROVIDER_SITE_OTHER): Payer: 59 | Admitting: Family Medicine

## 2020-07-23 ENCOUNTER — Other Ambulatory Visit: Payer: Self-pay | Admitting: Family Medicine

## 2020-07-23 VITALS — BP 133/81 | HR 78 | Ht 70.0 in | Wt 244.2 lb

## 2020-07-23 DIAGNOSIS — E1169 Type 2 diabetes mellitus with other specified complication: Secondary | ICD-10-CM

## 2020-07-23 DIAGNOSIS — E1165 Type 2 diabetes mellitus with hyperglycemia: Secondary | ICD-10-CM | POA: Diagnosis not present

## 2020-07-23 DIAGNOSIS — Z1159 Encounter for screening for other viral diseases: Secondary | ICD-10-CM

## 2020-07-23 DIAGNOSIS — R7989 Other specified abnormal findings of blood chemistry: Secondary | ICD-10-CM

## 2020-07-23 DIAGNOSIS — F5101 Primary insomnia: Secondary | ICD-10-CM

## 2020-07-23 DIAGNOSIS — F3342 Major depressive disorder, recurrent, in full remission: Secondary | ICD-10-CM

## 2020-07-23 DIAGNOSIS — Z0000 Encounter for general adult medical examination without abnormal findings: Secondary | ICD-10-CM

## 2020-07-23 DIAGNOSIS — E785 Hyperlipidemia, unspecified: Secondary | ICD-10-CM

## 2020-07-23 MED ORDER — TRULICITY 4.5 MG/0.5ML ~~LOC~~ SOAJ: 4.5000 mg | SUBCUTANEOUS | 3 refills | Status: DC

## 2020-07-23 MED ORDER — TRAZODONE HCL 100 MG PO TABS: 100.0000 mg | ORAL_TABLET | Freq: Every day | ORAL | 3 refills | Status: DC

## 2020-07-23 NOTE — Patient Instructions (Addendum)
Thank you for coming to the office today.  Inc dose of Trulicity from 3 to 4.5, new rx sent.  Increase Trazodone from 50 to 100mg    DUE for FASTING BLOOD WORK (no food or drink after midnight before the lab appointment, only water or coffee without cream/sugar on the morning of)  SCHEDULE "Lab Only" visit in the morning at the clinic for lab draw in 4 MONTHS   - Make sure Lab Only appointment is at about 1 week before your next appointment, so that results will be available  For Lab Results, once available within 2-3 days of blood draw, you can can log in to MyChart online to view your results and a brief explanation. Also, we can discuss results at next follow-up visit.   Please schedule a Follow-up Appointment to: Return in about 4 months (around 11/22/2020) for 4 month fasting lab only then 1 week later Annual Physical.  If you have any other questions or concerns, please feel free to call the office or send a message through MyChart. You may also schedule an earlier appointment if necessary.  Additionally, you may be receiving a survey about your experience at our office within a few days to 1 week by e-mail or mail. We value your feedback.  01/23/2021, DO [facility], [facility]

## 2020-07-23 NOTE — Progress Notes (Signed)
Subjective:    Patient ID: Curtis Parrish, male    DOB: 11/20/1980, 40 y.o.   MRN: [id number]  Curtis Parrish is a 40 y.o. male presenting on 07/23/2020 for Diabetes   HPI   TYPE 2 DIABETES / Hyperglycemia Hyperlipidemia / Obesity BMI >35  Prior history A1c >14, down to 12.3 last time with inc trulicity Now today result A1c 9.3 CBG: 200-275 - improved prior >300 He is still due for DM Eye exam has to schedule He admits improving lifestyle, goal to improve, reducing appetite. Meds:Metformin 1000mg  BID, Trulicity 3 mg weekly inj Not on ACEi/ARB Denies hypoglycemia, polyuria, visual changes, numbness or tingling.  Elevated LFTs / Hyperlipidemia Currently still on Rosuvastatin 20mg  daily improved LFT results he has had prior mild elevation but now has persistent elevation. Tolerating well without myalgia  Major Recurrent depression,in remission Insomnia Doing well on Sertraline and Trazodone - He failed Fluoxetine due to Migraine side effect in the past Still feels some nights not sleeping as well, dose on Trazodone 50mg  has tried 100mg  PRN with good result. Denies suicidal or homicidal ideation, or worsening anxiety    Depression screen [facility] 2/9 07/23/2020 03/19/2020 12/13/2019  Decreased Interest 1 0 0  Down, Depressed, Hopeless 1 0 0  PHQ - 2 Score 2 0 0  Altered sleeping 2 0 0  Tired, decreased energy 2 0 0  Change in appetite 2 0 0  Feeling bad or failure about yourself  1 0 0  Trouble concentrating 1 0 0  Moving slowly or fidgety/restless 0 0 0  Suicidal thoughts 0 0 0  PHQ-9 Score 10 0 0  Difficult doing work/chores Somewhat difficult Not difficult at all Not difficult at all    Social History   Tobacco Use  . Smoking status: Former Smoker    Packs/day: 0.50    Years: 30.00    Pack years: 15.00    Types: Cigarettes  . Smokeless tobacco: Former 4/9  . Vaping Use: Every day  Substance Use Topics  . Alcohol use: No    Comment: Abstaining from  alcohol for 15 years, prior history in age 51s  . Drug use: No    Review of Systems Per HPI unless specifically indicated above     Objective:    BP 133/81   Pulse 78   Ht 5\' 10"  (1.778 m)   Wt 244 lb 3.2 oz (110.8 kg)   SpO2 99%   BMI 35.04 kg/m   Wt Readings from Last 3 Encounters:  07/23/20 244 lb 3.2 oz (110.8 kg)  03/19/20 238 lb (108 kg)  12/13/19 (!) 233 lb 9.6 oz (106 kg)    Physical Exam Vitals and nursing note reviewed.  Constitutional:      General: He is not in acute distress.    Appearance: He is well-developed and well-nourished. He is obese. He is not diaphoretic.     Comments: Well-appearing, comfortable, cooperative  HENT:     Head: Normocephalic and atraumatic.     Mouth/Throat:     Mouth: Oropharynx is clear and moist.  Eyes:     General:        Right eye: No discharge.        Left eye: No discharge.     Conjunctiva/sclera: Conjunctivae normal.  Cardiovascular:     Rate and Rhythm: Normal rate.  Pulmonary:     Effort: Pulmonary effort is normal.  Musculoskeletal:        General:  No edema.  Skin:    General: Skin is warm and dry.     Findings: No erythema or rash.  Neurological:     Mental Status: He is alert and oriented to person, place, and time.  Psychiatric:        Mood and Affect: Mood and affect normal.        Behavior: Behavior normal.     Comments: Well groomed, good eye contact, normal speech and thoughts        Results for orders placed or performed in visit on 07/15/20  COMPLETE METABOLIC PANEL WITH GFR  Result Value Ref Range   Glucose, Bld 191 (H) 65 - 99 mg/dL   BUN 10 7 - 25 mg/dL   Creat 7.03 5.00 - 9.38 mg/dL   GFR, Est Non African American 109 > OR = 60 mL/min/1.5m2   GFR, Est African American 127 > OR = 60 mL/min/1.29m2   BUN/Creatinine Ratio NOT APPLICABLE 6 - 22 (calc)   Sodium 139 135 - 146 mmol/L   Potassium 4.1 3.5 - 5.3 mmol/L   Chloride 103 98 - 110 mmol/L   CO2 26 20 - 32 mmol/L   Calcium 9.8 8.6 -  10.3 mg/dL   Total Protein 7.3 6.1 - 8.1 g/dL   Albumin 4.5 3.6 - 5.1 g/dL   Globulin 2.8 1.9 - 3.7 g/dL (calc)   AG Ratio 1.6 1.0 - 2.5 (calc)   Total Bilirubin 1.1 0.2 - 1.2 mg/dL   Alkaline phosphatase (APISO) 62 36 - 130 U/L   AST 41 (H) 10 - 40 U/L   ALT 67 (H) 9 - 46 U/L  Hemoglobin A1c  Result Value Ref Range   Hgb A1c MFr Bld 9.3 (H) <5.7 % of total Hgb   Mean Plasma Glucose 220 mg/dL   eAG (mmol/L) 18.2 mmol/L      Assessment & Plan:   Problem List Items Addressed This Visit    Type 2 diabetes mellitus with hyperglycemia (HCC) - Primary    Improved in interval on GLP1 higher dose still, A1c down to 9.3 now Uncontrolled DM, but improved Complications - hyperlipidemia, depression, obesity, hyperglycemia - increases risk of future cardiovascular complications    Plan:  1. INCREASE Trulicity from 3mg  weekly up to 4.5mg  weekly 2. Continue Metformin IR 1000mg  BID  Encourage improved lifestyle - low carb, low sugar diet, reduce portion size, continue improving regular exercise Check CBG, bring log to next visit for review  Continue statin for now, elevated LFT - see A&P, may adjust dose next time Advised to schedule DM ophtho exam, send record      Relevant Medications   TRULICITY 4.5 MG/0.5ML SOPN   Primary insomnia    Persistent problem See A&P  Plan Continue sertraline 50mg  daily Increase Trazodone from 50 to 100mg  nightly new rx  - If not improving still we can consider referral to psychiatry or behavioral health if indicated      Relevant Medications   traZODone (DESYREL) 100 MG tablet   Major depression, recurrent, full remission (HCC)    Chronic depression, recurrent chronic - now in remission on Sertraline Failed Fluoxetine due to side effect migraine Ineffective / off Wellbutrin Associated secondary insomnia  Plan Continue sertraline 50mg  daily, Trazodone inc to 100mg  nightly - If not improving still we can consider referral to psychiatry or  behavioral health if indicated      Relevant Medications   traZODone (DESYREL) 100 MG tablet   Hyperlipidemia associated with type  2 diabetes mellitus (HCC)    Previous improved LDL on statin Elevated ASCVD in DM2  Normalized LFTs, not due to statin now  Plan Continue Statin current dose Rosuvastatin 20mg  nightly      Relevant Medications   TRULICITY 4.5 MG/0.5ML SOPN    Other Visit Diagnoses    Elevated LFTs          Meds ordered this encounter  Medications  . TRULICITY 4.5 MG/0.5ML SOPN    Sig: Inject 4.5 mg as directed once a week.    Dispense:  6 mL    Refill:  3    Dose increase from 3 to 4.5  . traZODone (DESYREL) 100 MG tablet    Sig: Take 1 tablet (100 mg total) by mouth at bedtime.    Dispense:  90 tablet    Refill:  3    Dose increase from 50 to 100      Follow up plan: Return in about 4 months (around 11/22/2020) for 4 month fasting lab only then 1 week later Annual Physical.  Future labs ordered for 11/2020   12/2020, DO [facility] 07/23/2020, 8:35 AM

## 2020-07-23 NOTE — Assessment & Plan Note (Signed)
Chronic depression, recurrent chronic - now in remission on Sertraline Failed Fluoxetine due to side effect migraine Ineffective / off Wellbutrin Associated secondary insomnia  Plan Continue sertraline 50mg  daily, Trazodone inc to 100mg  nightly - If not improving still we can consider referral to psychiatry or behavioral health if indicated

## 2020-07-23 NOTE — Assessment & Plan Note (Signed)
Previous improved LDL on statin Elevated ASCVD in DM2  Normalized LFTs, not due to statin now  Plan Continue Statin current dose Rosuvastatin 20mg  nightly

## 2020-07-23 NOTE — Assessment & Plan Note (Signed)
Improved in interval on GLP1 higher dose still, A1c down to 9.3 now Uncontrolled DM, but improved Complications - hyperlipidemia, depression, obesity, hyperglycemia - increases risk of future cardiovascular complications    Plan:  1. INCREASE Trulicity from 3mg  weekly up to 4.5mg  weekly 2. Continue Metformin IR 1000mg  BID  Encourage improved lifestyle - low carb, low sugar diet, reduce portion size, continue improving regular exercise Check CBG, bring log to next visit for review  Continue statin for now, elevated LFT - see A&P, may adjust dose next time Advised to schedule DM ophtho exam, send record

## 2020-07-23 NOTE — Assessment & Plan Note (Signed)
Persistent problem See A&P  Plan Continue sertraline 50mg  daily Increase Trazodone from 50 to 100mg  nightly new rx  - If not improving still we can consider referral to psychiatry or behavioral health if indicated

## 2020-08-18 ENCOUNTER — Other Ambulatory Visit: Payer: Self-pay | Admitting: Family Medicine

## 2020-08-18 DIAGNOSIS — I10 Essential (primary) hypertension: Secondary | ICD-10-CM

## 2020-08-18 NOTE — Telephone Encounter (Signed)
Requested Prescriptions  Pending Prescriptions Disp Refills  . amLODipine (NORVASC) 10 MG tablet [Pharmacy Med Name: amLODIPine Besylate 10 MG Oral Tablet] 90 tablet 1    Sig: TAKE 1 TABLET BY MOUTH  DAILY     Cardiovascular:  Calcium Channel Blockers Passed - 08/18/2020 10:25 PM      Passed - Last BP in normal range    BP Readings from Last 1 Encounters:  07/23/20 133/81         Passed - Valid encounter within last 6 months    Recent Outpatient Visits          3 weeks ago Type 2 diabetes mellitus with hyperglycemia, without long-term current use of insulin [facility])   [facility], Netta Neat, DO   5 months ago Type 2 diabetes mellitus with hyperglycemia, without long-term current use of insulin [facility])   [facility], Netta Neat, DO   8 months ago Type 2 diabetes mellitus with hyperglycemia, without long-term current use of insulin [facility])   [facility] Smitty Cords, DO   8 months ago Annual physical exam   [facility] Smitty Cords, DO   9 months ago Essential hypertension   [facility] [place], Netta Neat, DO

## 2020-08-20 ENCOUNTER — Other Ambulatory Visit: Payer: Self-pay | Admitting: Family Medicine

## 2020-08-20 DIAGNOSIS — J0110 Acute frontal sinusitis, unspecified: Secondary | ICD-10-CM

## 2020-08-20 MED ORDER — FLUTICASONE PROPIONATE 50 MCG/ACT NA SUSP: 2.0000 | Freq: Every day | NASAL | 6 refills | Status: AC

## 2020-08-20 MED ORDER — AMOXICILLIN-POT CLAVULANATE 875-125 MG PO TABS: 1.0000 | ORAL_TABLET | Freq: Two times a day (BID) | ORAL | 0 refills | Status: DC

## 2020-10-01 ENCOUNTER — Other Ambulatory Visit: Payer: Self-pay | Admitting: Family Medicine

## 2020-10-01 DIAGNOSIS — E785 Hyperlipidemia, unspecified: Secondary | ICD-10-CM

## 2020-10-01 DIAGNOSIS — E1169 Type 2 diabetes mellitus with other specified complication: Secondary | ICD-10-CM

## 2020-11-15 ENCOUNTER — Other Ambulatory Visit: Payer: Self-pay | Admitting: Family Medicine

## 2020-11-15 DIAGNOSIS — R1013 Epigastric pain: Secondary | ICD-10-CM

## 2020-11-16 NOTE — Telephone Encounter (Signed)
last RF 01/01/20 #90 3 RF

## 2020-12-12 ENCOUNTER — Other Ambulatory Visit: Payer: Self-pay | Admitting: Family Medicine

## 2020-12-12 DIAGNOSIS — E1165 Type 2 diabetes mellitus with hyperglycemia: Secondary | ICD-10-CM

## 2020-12-21 ENCOUNTER — Other Ambulatory Visit: Payer: Self-pay | Admitting: Family Medicine

## 2020-12-21 DIAGNOSIS — F3342 Major depressive disorder, recurrent, in full remission: Secondary | ICD-10-CM

## 2020-12-22 NOTE — Telephone Encounter (Signed)
  Notes to clinic:  Patient was due for follow up on 11/22/2020  Review for refills   Requested Prescriptions  Pending Prescriptions Disp Refills   sertraline (ZOLOFT) 50 MG tablet [Pharmacy Med Name: Sertraline HCl 50 MG Oral Tablet] 90 tablet 3    Sig: TAKE 1 TABLET BY MOUTH  DAILY WITH BREAKFAST      Psychiatry:  Antidepressants - SSRI Passed - 12/21/2020 10:34 PM      Passed - Completed PHQ-2 or PHQ-9 in the last 360 days      Passed - Valid encounter within last 6 months    Recent Outpatient Visits           5 months ago Type 2 diabetes mellitus with hyperglycemia, without long-term current use of insulin [facility])   [facility], Netta Neat, DO   9 months ago Type 2 diabetes mellitus with hyperglycemia, without long-term current use of insulin [facility])   [facility] [place], Netta Neat, DO   1 year ago Type 2 diabetes mellitus with hyperglycemia, without long-term current use of insulin [facility])   [facility] Althea Charon, Netta Neat, DO   1 year ago Annual physical exam   [facility] Smitty Cords, DO   1 year ago Essential hypertension   [facility], Netta Neat, DO

## 2021-01-29 ENCOUNTER — Other Ambulatory Visit: Payer: Self-pay | Admitting: Family Medicine

## 2021-01-29 DIAGNOSIS — I10 Essential (primary) hypertension: Secondary | ICD-10-CM

## 2021-01-29 NOTE — Telephone Encounter (Signed)
Patient will need an office visit for further refills  

## 2021-02-02 LAB — HM DIABETES EYE EXAM

## 2021-03-04 ENCOUNTER — Other Ambulatory Visit: Payer: Self-pay | Admitting: Family Medicine

## 2021-03-04 DIAGNOSIS — E1165 Type 2 diabetes mellitus with hyperglycemia: Secondary | ICD-10-CM

## 2021-03-05 NOTE — Telephone Encounter (Signed)
Requested medications are due for refill today.  yes  Requested medications are on the active medications list.  yes  Last refill. 12/12/2020  Future visit scheduled.   no  Notes to clinic.  Per note from 07/23/2020 visit pt was to return in July for follow up.

## 2021-03-11 ENCOUNTER — Other Ambulatory Visit: Payer: Self-pay | Admitting: Family Medicine

## 2021-03-11 DIAGNOSIS — E785 Hyperlipidemia, unspecified: Secondary | ICD-10-CM

## 2021-03-12 NOTE — Telephone Encounter (Signed)
Requested Prescriptions  Pending Prescriptions Disp Refills  . rosuvastatin (CRESTOR) 20 MG tablet [Pharmacy Med Name: Rosuvastatin Calcium 20 MG Oral Tablet] 90 tablet 1    Sig: TAKE 1 TABLET BY MOUTH AT  BEDTIME     Cardiovascular:  Antilipid - Statins Failed - 03/11/2021 11:10 PM      Failed - Total Cholesterol in normal range and within 360 days    Cholesterol, Total  Date Value Ref Range Status  09/16/2016 187 100 - 199 mg/dL Final   Cholesterol  Date Value Ref Range Status  03/10/2020 87 <200 mg/dL Final         Failed - LDL in normal range and within 360 days    LDL Cholesterol (Calc)  Date Value Ref Range Status  03/10/2020 36 mg/dL (calc) Final    Comment:    Reference range: <100 . Desirable range <100 mg/dL for primary prevention;   <70 mg/dL for patients with CHD or diabetic patients  with > or = 2 CHD risk factors. Marland Kitchen LDL-C is now calculated using the Martin-Hopkins  calculation, which is a validated novel method providing  better accuracy than the Friedewald equation in the  estimation of LDL-C.  Horald Pollen et al. Lenox Ahr. 9476;546(50): 2061-2068  (http://education.[employer].com/faq/FAQ164)          Failed - HDL in normal range and within 360 days    HDL  Date Value Ref Range Status  03/10/2020 27 (L) > OR = 40 mg/dL Final  35/46/5681 31 (L) >39 mg/dL Final         Failed - Triglycerides in normal range and within 360 days    Triglycerides  Date Value Ref Range Status  03/10/2020 153 (H) <150 mg/dL Final         Passed - Patient is not pregnant      Passed - Valid encounter within last 12 months    Recent Outpatient Visits          7 months ago Type 2 diabetes mellitus with hyperglycemia, without long-term current use of insulin [facility])   [facility], Netta Neat, DO   11 months ago Type 2 diabetes mellitus with hyperglycemia, without long-term current use of insulin [facility])   [facility] [place],  Netta Neat, DO   1 year ago Type 2 diabetes mellitus with hyperglycemia, without long-term current use of insulin [facility])   [facility] Althea Charon, Netta Neat, DO   1 year ago Annual physical exam   [facility] Smitty Cords, DO   1 year ago Essential hypertension   [facility], Netta Neat, DO

## 2021-03-13 ENCOUNTER — Other Ambulatory Visit: Payer: Self-pay | Admitting: Family Medicine

## 2021-03-13 DIAGNOSIS — I10 Essential (primary) hypertension: Secondary | ICD-10-CM

## 2021-03-14 NOTE — Telephone Encounter (Signed)
Pt needs appt for additional refills

## 2021-03-14 NOTE — Telephone Encounter (Signed)
Called pt and made appt for 03/16/21.

## 2021-03-16 ENCOUNTER — Other Ambulatory Visit: Payer: Self-pay

## 2021-03-16 ENCOUNTER — Encounter: Payer: Self-pay | Admitting: Family Medicine

## 2021-03-16 ENCOUNTER — Ambulatory Visit (INDEPENDENT_AMBULATORY_CARE_PROVIDER_SITE_OTHER): Payer: 59 | Admitting: Family Medicine

## 2021-03-16 VITALS — BP 148/86 | HR 99 | Ht 70.0 in | Wt 240.8 lb

## 2021-03-16 DIAGNOSIS — R1084 Generalized abdominal pain: Secondary | ICD-10-CM

## 2021-03-16 DIAGNOSIS — R140 Abdominal distension (gaseous): Secondary | ICD-10-CM | POA: Diagnosis not present

## 2021-03-16 DIAGNOSIS — R112 Nausea with vomiting, unspecified: Secondary | ICD-10-CM

## 2021-03-16 DIAGNOSIS — F331 Major depressive disorder, recurrent, moderate: Secondary | ICD-10-CM

## 2021-03-16 DIAGNOSIS — E1165 Type 2 diabetes mellitus with hyperglycemia: Secondary | ICD-10-CM | POA: Diagnosis not present

## 2021-03-16 MED ORDER — SERTRALINE HCL 100 MG PO TABS: 100.0000 mg | ORAL_TABLET | Freq: Every day | ORAL | 3 refills | Status: DC

## 2021-03-16 MED ORDER — ONDANSETRON 4 MG PO TBDP: 4.0000 mg | ORAL_TABLET | Freq: Three times a day (TID) | ORAL | 0 refills | Status: DC | PRN

## 2021-03-16 NOTE — Assessment & Plan Note (Signed)
Still elevated A1c >9 on previous, will repeat A1c now but CBG history and med adherence issues side effects on GLP1 discussed limitations in managing his DM at this time. Uncontrolled DM, but improved Complications - hyperlipidemia, depression, obesity, hyperglycemia - increases risk of future cardiovascular complications    Plan:  Referral to [facility] Endocrinology for consultation on further diabetes management - may warrant mixture of insulin regimen or consider Mounjaro or other options  Continue current meds Trulicity, Metformin  Encourage improved lifestyle - low carb, low sugar diet, reduce portion size, continue improving regular exercise Check CBG, bring log to next visit for review  DM Foot  Continue statin for now, elevated LFT - see A&P, may adjust dose next time Advised to schedule DM ophtho exam, send record

## 2021-03-16 NOTE — Assessment & Plan Note (Signed)
Chronic depression, recurrent chronic - now in remission on Sertraline Failed Fluoxetine due to side effect migraine Ineffective / off Wellbutrin Associated secondary insomnia  Plan Dose increase sertraline 50mg  up to 100mg  daily, Trazodone inc to 100mg  nightly - If not improving still we can consider referral to psychiatry or behavioral health if indicated

## 2021-03-16 NOTE — Patient Instructions (Addendum)
Thank you for coming to the office today.  Future we can consider GI referral if not improving, and not due to med side effects.  Zoloft generic ordered 100mg  to [facility]um  ------------------------------------------------------------------------  Referral to Diabetes specialist - Endocrinology  Stay tuned for apt, concern meds may be causing some of your digestive side effects.  [facility] [address] [place], [place]  [place] Phone: [phone]  [facility] [address] [place], [place]  [place] Phone: [phone]  ---------------------------------  Abdominal Ultrasound ordered, if you don't hear from them with scheduling within 48 hours, you can call to schedule.  [facility] [address] [place],  [place]  [place]  Main: [phone]    Please schedule a Follow-up Appointment to: Return in about 6 weeks (around 04/27/2021) for 6 week follow-up DM Endocrine / GI symptoms.  If you have any other questions or concerns, please feel free to call the office or send a message through MyChart. You may also schedule an earlier appointment if necessary.  Additionally, you may be receiving a survey about your experience at our office within a few days to 1 week by e-mail or mail. We value your feedback.  14/04/2021, DO [facility], [facility]

## 2021-03-16 NOTE — Progress Notes (Signed)
Subjective:    Patient ID: Curtis Parrish, male    DOB: 02-15-1981, 40 y.o.   MRN: [id number]  Curtis Parrish is a 40 y.o. male presenting on 03/16/2021 for Depression and Anxiety   HPI  TYPE 2 DIABETES / Hyperglycemia Hyperlipidemia / Obesity BMI >34 Previous A1c down to 9.3 in March 2022 He has been on highest dose Trulicity 4.5mg  weekly not seeing improvement. CBG: 200-275 - improved prior >300 He is still due for DM Eye exam has to schedule He admits improving lifestyle, goal to improve, reducing appetite. Meds: Metformin 1000mg  BID, Trulicity 4.5 mg weekly inj Not on ACEi/ARB Denies hypoglycemia, polyuria, visual changes, numbness or tingling.   Elevated LFTs / Hyperlipidemia Currently still on Rosuvastatin 20mg  daily improved LFT results he has had prior mild elevation but now has persistent elevation. Tolerating well without myalgia   Post-prandial nausea vomiting Reports worsening problem with generalized abdominal pain and bloating, nausea vomiting. He will often have symptoms onset after eating shortly, improves with burping or   Major Recurrent depression, in remission Insomnia Doing well on Sertraline and Trazodone - He failed Fluoxetine due to Migraine side effect in the past  He is interested in Zoloft dose adjustment  Still feels some nights not sleeping as well, dose on Trazodone 50mg  has tried 100mg  PRN with good result. Denies suicidal or homicidal ideation, or worsening anxiety   Health Maintenance: Declines vaccines.  Depression screen [facility] 2/9 03/16/2021 07/23/2020 03/19/2020  Decreased Interest 3 1 0  Down, Depressed, Hopeless 2 1 0  PHQ - 2 Score 5 2 0  Altered sleeping 0 2 0  Tired, decreased energy 2 2 0  Change in appetite 2 2 0  Feeling bad or failure about yourself  0 1 0  Trouble concentrating 0 1 0  Moving slowly or fidgety/restless 0 0 0  Suicidal thoughts 0 0 0  PHQ-9 Score 9 10 0  Difficult doing work/chores Somewhat difficult Somewhat  difficult Not difficult at all  Some recent data might be hidden    Social History   Tobacco Use   Smoking status: Former    Packs/day: 0.50    Years: 30.00    Pack years: 15.00    Types: Cigarettes   Smokeless tobacco: Former  4/9 Use: Every day  Substance Use Topics   Alcohol use: No    Comment: Abstaining from alcohol for 15 years, prior history in age 86s   Drug use: No    Review of Systems Per HPI unless specifically indicated above     Objective:    BP (!) 148/86   Pulse 99   Ht 5\' 10"  (1.778 m)   Wt 240 lb 12.8 oz (109.2 kg)   SpO2 100%   BMI 34.55 kg/m   Wt Readings from Last 3 Encounters:  03/16/21 240 lb 12.8 oz (109.2 kg)  07/23/20 244 lb 3.2 oz (110.8 kg)  03/19/20 238 lb (108 kg)    Physical Exam Vitals and nursing note reviewed.  Constitutional:      General: He is not in acute distress.    Appearance: He is well-developed. He is not diaphoretic.     Comments: Well-appearing, comfortable, cooperative  HENT:     Head: Normocephalic and atraumatic.  Eyes:     General:        Right eye: No discharge.        Left eye: No discharge.     Conjunctiva/sclera: Conjunctivae normal.  Neck:     Thyroid: No thyromegaly.  Cardiovascular:     Rate and Rhythm: Normal rate and regular rhythm.     Pulses: Normal pulses.     Heart sounds: Normal heart sounds. No murmur heard. Pulmonary:     Effort: Pulmonary effort is normal. No respiratory distress.     Breath sounds: Normal breath sounds. No wheezing or rales.  Musculoskeletal:        General: Normal range of motion.     Cervical back: Normal range of motion and neck supple.  Lymphadenopathy:     Cervical: No cervical adenopathy.  Skin:    General: Skin is warm and dry.     Findings: No erythema or rash.  Neurological:     Mental Status: He is alert and oriented to person, place, and time. Mental status is at baseline.  Psychiatric:        Behavior: Behavior normal.     Comments:  Well groomed, good eye contact, normal speech and thoughts    Diabetic Foot Exam - Simple   Simple Foot Form Diabetic Foot exam was performed with the following findings: Yes 03/16/2021  9:45 AM  Visual Inspection See comments: Yes Sensation Testing Intact to touch and monofilament testing bilaterally: Yes Pulse Check Posterior Tibialis and Dorsalis pulse intact bilaterally: Yes Comments Bilateral feet with callus formation without ulceration. Intact monofilament.       Results for orders placed or performed in visit on 02/05/21  HM DIABETES EYE EXAM  Result Value Ref Range   HM Diabetic Eye Exam No Retinopathy No Retinopathy      Assessment & Plan:   Problem List Items Addressed This Visit     Type 2 diabetes mellitus with hyperglycemia (HCC) - Primary    Still elevated A1c >9 on previous, will repeat A1c now but CBG history and med adherence issues side effects on GLP1 discussed limitations in managing his DM at this time. Uncontrolled DM, but improved Complications - hyperlipidemia, depression, obesity, hyperglycemia - increases risk of future cardiovascular complications    Plan:  Referral to [facility] Endocrinology for consultation on further diabetes management - may warrant mixture of insulin regimen or consider Mounjaro or other options  Continue current meds Trulicity, Metformin  Encourage improved lifestyle - low carb, low sugar diet, reduce portion size, continue improving regular exercise Check CBG, bring log to next visit for review  DM Foot  Continue statin for now, elevated LFT - see A&P, may adjust dose next time Advised to schedule DM ophtho exam, send record      Relevant Orders   COMPLETE METABOLIC PANEL WITH GFR   Hemoglobin A1c   Microalbumin, urine   Ambulatory referral to Endocrinology   Major depressive disorder, recurrent episode (HCC)    Chronic depression, recurrent chronic - now in remission on Sertraline Failed Fluoxetine due to side  effect migraine Ineffective / off Wellbutrin Associated secondary insomnia  Plan Dose increase sertraline 50mg  up to 100mg  daily, Trazodone inc to 100mg  nightly - If not improving still we can consider referral to psychiatry or behavioral health if indicated      Relevant Medications   sertraline (ZOLOFT) 100 MG tablet   Other Visit Diagnoses     Generalized abdominal pain       Relevant Orders   ABDOMEN LIMITED RUQ (LIVER/GB)   COMPLETE METABOLIC PANEL WITH GFR   Postprandial abdominal bloating       Relevant Orders   ABDOMEN LIMITED RUQ (  LIVER/GB)   COMPLETE METABOLIC PANEL WITH GFR   Nausea and vomiting, unspecified vomiting type       Relevant Medications   ondansetron (ZOFRAN ODT) 4 MG disintegrating tablet   Other Relevant Orders   US ABDOMEN LIMITED RUQ (LIVER/GB)       Chronic RUQ vs Abdominal Pain Generalized Nausea vomiting post prandial Question if gallbladder related, seems to have variety or constellation of indigestion symptoms On PPI therapy already Will check RUQ Abd [place] and CMET for LFT panel, has had elevated LFTs before Anticipate referral to GI if persistent and unresolved.  Orders Placed This Encounter  Procedures   US ABDOMEN LIMITED RUQ (LIVER/GB)    Standing Status:   Future    Standing Expiration Date:   07/14/2021    Order Specific Question:   Reason for exam:    Answer:   RUQ and Generalized postprandial abdominal pain with nausea vomiting    Order Specific Question:   Preferred imaging location?    Answer:   [facility]   COMPLETE METABOLIC PANEL WITH GFR   Hemoglobin A1c   Microalbumin, urine   Ambulatory referral to Endocrinology    Referral Priority:   Routine    Referral Type:   Consultation    Referral Reason:   Specialty Services Required    Number of Visits Requested:   1    Meds ordered this encounter  Medications   sertraline (ZOLOFT) 100 MG tablet    Sig: Take 1 tablet (100 mg total) by mouth daily.     Dispense:  90 tablet    Refill:  3    Dose increase from 50 to 100   ondansetron (ZOFRAN ODT) 4 MG disintegrating tablet    Sig: Take 1 tablet (4 mg total) by mouth every 8 (eight) hours as needed for nausea or vomiting.    Dispense:  30 tablet    Refill:  0     Follow up plan: Return in about 6 weeks (around 04/27/2021) for 6 week follow-up DM Endocrine / GI symptoms.   Saralyn Pilar, DO [facility] 03/16/2021, 9:41 AM

## 2021-03-17 ENCOUNTER — Ambulatory Visit
Admission: RE | Admit: 2021-03-17 | Discharge: 2021-03-17 | Disposition: A | Discharge status: Home / Self Care | Payer: 59 | Source: Ambulatory Visit | Attending: Family Medicine | Admitting: Family Medicine

## 2021-03-17 DIAGNOSIS — R140 Abdominal distension (gaseous): Secondary | ICD-10-CM | POA: Insufficient documentation

## 2021-03-17 DIAGNOSIS — R112 Nausea with vomiting, unspecified: Secondary | ICD-10-CM | POA: Diagnosis present

## 2021-03-17 DIAGNOSIS — R1084 Generalized abdominal pain: Secondary | ICD-10-CM | POA: Diagnosis present

## 2021-03-17 LAB — MICROALBUMIN, URINE: Microalb, Ur: 1.6 mg/dL

## 2021-03-17 LAB — COMPLETE METABOLIC PANEL WITH GFR
AG Ratio: 1.5 (calc) (ref 1.0–2.5)
ALT: 112 U/L — ABNORMAL HIGH (ref 9–46)
AST: 71 U/L — ABNORMAL HIGH (ref 10–40)
Albumin: 4.9 g/dL (ref 3.6–5.1)
Alkaline phosphatase (APISO): 88 U/L (ref 36–130)
BUN: 8 mg/dL (ref 7–25)
CO2: 25 mmol/L (ref 20–32)
Calcium: 10.4 mg/dL — ABNORMAL HIGH (ref 8.6–10.3)
Chloride: 95 mmol/L — ABNORMAL LOW (ref 98–110)
Creat: 0.84 mg/dL (ref 0.60–1.29)
Globulin: 3.2 g/dL (calc) (ref 1.9–3.7)
Glucose, Bld: 421 mg/dL — ABNORMAL HIGH (ref 65–139)
Potassium: 4.3 mmol/L (ref 3.5–5.3)
Sodium: 134 mmol/L — ABNORMAL LOW (ref 135–146)
Total Bilirubin: 1 mg/dL (ref 0.2–1.2)
Total Protein: 8.1 g/dL (ref 6.1–8.1)
eGFR: 112 mL/min/{1.73_m2} (ref >=60)

## 2021-03-17 LAB — HEMOGLOBIN A1C
Hgb A1c MFr Bld: 12.3 % of total Hgb — ABNORMAL HIGH (ref <5.7)
Mean Plasma Glucose: 306 mg/dL
eAG (mmol/L): 17 mmol/L

## 2021-03-30 ENCOUNTER — Encounter: Payer: Self-pay | Admitting: Family Medicine

## 2021-04-03 ENCOUNTER — Encounter: Payer: Self-pay | Admitting: Family Medicine

## 2021-04-29 ENCOUNTER — Encounter: Payer: Self-pay | Admitting: Family Medicine

## 2021-04-29 DIAGNOSIS — H66001 Acute suppurative otitis media without spontaneous rupture of ear drum, right ear: Secondary | ICD-10-CM

## 2021-04-29 DIAGNOSIS — L089 Local infection of the skin and subcutaneous tissue, unspecified: Secondary | ICD-10-CM

## 2021-04-29 DIAGNOSIS — L98492 Non-pressure chronic ulcer of skin of other sites with fat layer exposed: Secondary | ICD-10-CM

## 2021-04-29 MED ORDER — AMOXICILLIN-POT CLAVULANATE 875-125 MG PO TABS: 1.0000 | ORAL_TABLET | Freq: Two times a day (BID) | ORAL | 0 refills | Status: DC

## 2021-05-28 ENCOUNTER — Telehealth (INDEPENDENT_AMBULATORY_CARE_PROVIDER_SITE_OTHER): Payer: 59 | Admitting: Internal Medicine

## 2021-05-28 ENCOUNTER — Encounter: Payer: Self-pay | Admitting: Internal Medicine

## 2021-05-28 DIAGNOSIS — J029 Acute pharyngitis, unspecified: Secondary | ICD-10-CM | POA: Diagnosis not present

## 2021-05-28 DIAGNOSIS — R051 Acute cough: Secondary | ICD-10-CM | POA: Diagnosis not present

## 2021-05-28 DIAGNOSIS — R0981 Nasal congestion: Secondary | ICD-10-CM

## 2021-05-28 DIAGNOSIS — R509 Fever, unspecified: Secondary | ICD-10-CM

## 2021-05-28 DIAGNOSIS — R52 Pain, unspecified: Secondary | ICD-10-CM

## 2021-05-28 LAB — POCT INFLUENZA A/B
Influenza A, POC: NEGATIVE
Influenza B, POC: NEGATIVE

## 2021-05-28 MED ORDER — NIRMATRELVIR/RITONAVIR (PAXLOVID)TABLET: 3.0000 | ORAL_TABLET | Freq: Two times a day (BID) | ORAL | 0 refills | Status: AC

## 2021-05-28 NOTE — Addendum Note (Signed)
Addended by: Jearld Fenton on: 05/28/2021 02:32 PM   Modules accepted: Orders

## 2021-05-28 NOTE — Patient Instructions (Signed)
Viral Illness, Adult ?Viruses are tiny germs that can get into a person's body and cause illness. There are many different types of viruses, and they cause many types of illness. Viral illnesses can range from mild to severe. They can affect various parts of the body. ?Short-term conditions that are caused by a virus include colds and the flu (influenza). Long-term conditions that are caused by a virus include herpes, shingles, and HIV (human immunodeficiency virus) infection. A few viruses have been linked to certain cancers. ?What are the causes? ?Many types of viruses can cause illness. Viruses invade cells in your body, multiply, and cause the infected cells to work abnormally or die. When these cells die, they release more of the virus. When this happens, you develop symptoms of the illness, and the virus continues to spread to other cells. If the virus takes over the function of the cell, it can cause the cell to divide and grow out of control. This happens when a virus causes cancer. ?Different viruses get into the body in different ways. You can get a virus by: ?Swallowing food or water that has come in contact with the virus (is contaminated). ?Breathing in droplets that have been coughed or sneezed into the air by an infected person. ?Touching a surface that has been contaminated with the virus and then touching your eyes, nose, or mouth. ?Being bitten by an insect or animal that carries the virus. ?Having sexual contact with a person who is infected with the virus. ?Being exposed to blood or fluids that contain the virus, either through an open cut or during a transfusion. ?If a virus enters your body, your body's defense system (immune system) will try to fight the virus. You may be at higher risk for a viral illness if your immune system is weak. ?What are the signs or symptoms? ?You may have these symptoms, depending on the type of virus and the location of the cells that it invades: ?Cold and flu  viruses: ?Fever. ?Headache. ?Sore throat. ?Muscle aches. ?Stuffy nose (nasal congestion). ?Cough. ?Digestive system (gastrointestinal) viruses: ?Fever. ?Pain in the abdomen. ?Nausea. ?Diarrhea. ?Liver viruses (hepatitis): ?Loss of appetite. ?Tiredness. ?Skin or the white parts of your eyes turning yellow (jaundice). ?Brain and spinal cord viruses: ?Fever. ?Headache. ?Stiff neck. ?Nausea and vomiting. ?Confusion or sleepiness. ?Skin viruses: ?Warts. ?Itching. ?Rash. ?Sexually transmitted viruses: ?Discharge. ?Swelling. ?Redness. ?Rash. ?How is this diagnosed? ?This condition may be diagnosed based on one or more of the following: ?Symptoms. ?Medical history. ?Physical exam. ?Blood test, sample of mucus from your lungs (sputum sample), stool sample, or a swab of body fluids or a skin sore (lesion). ?How is this treated? ?Viruses can be hard to treat because they live within cells. Antibiotic medicines do not treat viruses because these medicines do not get inside cells. Treatment for a viral illness may include: ?Resting and drinking plenty of fluids. ?Medicines to relieve symptoms. These can include over-the-counter medicine for pain and fever, medicines for cough or congestion, and medicines to relieve diarrhea. ?Antiviral medicines. These medicines are available only for certain types of viruses. ?Some viral illnesses can be prevented with vaccinations. A common example is the flu shot. ?Follow these instructions at home: ?Medicines ?Take over-the-counter and prescription medicines only as told by your health care provider. ?If you were prescribed an antiviral medicine, take it as told by your health care provider. Do not stop taking the antiviral even if you start to feel better. ?Be aware of when antibiotics are   needed and when they are not needed. Antibiotics do not treat viruses. You may get an antibiotic if your health care provider thinks that you may have, or are at risk for, a bacterial infection and you  have a viral infection. ?Do not ask for an antibiotic prescription if you have been diagnosed with a viral illness. Antibiotics will not make your illness go away faster. ?Frequently taking antibiotics when they are not needed can lead to antibiotic resistance. When this develops, the medicine no longer works against the bacteria that it normally fights. ?General instructions ? ?Drink enough fluids to keep your urine pale yellow. ?Rest as much as possible. ?Return to your normal activities as told by your health care provider. Ask your health care provider what activities are safe for you. ?Keep all follow-up visits as told by your health care provider. This is important. ?How is this prevented? ?To reduce your risk of viral illness: ?Wash your hands often with soap and water for at least 20 seconds. If soap and water are not available, use hand sanitizer. ?Avoid touching your nose, eyes, and mouth, especially if you have not washed your hands recently. ?If anyone in your household has a viral infection, clean all household surfaces that may have been in contact with the virus. Use soap and hot water. You may also use bleach that you have added water to (diluted). ?Stay away from people who are sick with symptoms of a viral infection. ?Do not share items such as toothbrushes and water bottles with other people. ?Keep your vaccinations up to date. This includes getting a yearly flu shot. ?Eat a healthy diet and get plenty of rest. ?Contact a health care provider if: ?You have symptoms of a viral illness that do not go away. ?Your symptoms come back after going away. ?Your symptoms get worse. ?Get help right away if you have: ?Trouble breathing. ?A severe headache or a stiff neck. ?Severe vomiting or pain in your abdomen. ?These symptoms may represent a serious problem that is an emergency. Do not wait to see if the symptoms will go away. Get medical help right away. Call your local emergency services (911 in the  [place] Do not drive yourself to the hospital. ?Summary ?Viruses are types of germs that can get into a person's body and cause illness. Viral illnesses can range from mild to severe. They can affect various parts of the body. ?Viruses can be hard to treat. There are medicines to relieve symptoms, and there are some antiviral medicines. ?If you were prescribed an antiviral medicine, take it as told by your health care provider. Do not stop taking the antiviral even if you start to feel better. ?Contact a health care provider if you have symptoms of a viral illness that do not go away. ?This information is not intended to replace advice given to you by your health care provider. Make sure you discuss any questions you have with your health care provider. ?Document Revised: 09/17/2019 Document Reviewed: 03/13/2019 ?Elsevier Patient Education ? 2022 [employer] ? ?

## 2021-05-28 NOTE — Addendum Note (Signed)
Addended by: Lorre Munroe on: 05/28/2021 02:48 PM   Modules accepted: Orders

## 2021-05-28 NOTE — Progress Notes (Addendum)
Virtual Visit via Video Note  I connected with [name] on 05/28/21 at  4:00 PM EST by a video enabled telemedicine application and verified that I am speaking with the correct person using two identifiers.  Location: Patient: Home Provider: Office  Person's participating in this video call: Curtis Silversmith, NP and Chanan Detwiler (for [employer])   I discussed the limitations of evaluation and management by telemedicine and the availability of in person appointments. The patient expressed understanding and agreed to proceed.  History of Present Illness:  Pt reports nasal congestion, post nasal drip, sore throat and cough. This started 4 days ago. He is not blowing any mucous out of his nose. He denies difficulty swallowing. The cough is productive of clear mucous. He denies headache, runny nose, ear pain, shortness of breath, nausea, vomiting or diarrhea. He has had fever, chills and body aches. He has tried Dayquil OTC with minimal relief of symptoms. He has not had exposure to covid or flu that he is aware of, but has had sick contacts with similar symptoms.   Past Medical History:  Diagnosis Date   Anxiety    Depression    Hypertension     Current Outpatient Medications  Medication Sig Dispense Refill   amLODipine (NORVASC) 10 MG tablet TAKE 1 TABLET BY MOUTH  DAILY 30 tablet 11   amoxicillin-clavulanate (AUGMENTIN) 875-125 MG tablet Take 1 tablet by mouth 2 (two) times daily. 20 tablet 0   Blood Glucose Calibration (OT ULTRA/FASTTK CNTRL SOLN) SOLN Use to check blood sugar twice a day 1 each 0   Blood Glucose Monitoring Suppl (ONE TOUCH ULTRA 2) w/Device KIT Use to check blood sugar twice a day 1 kit 0   fluticasone (FLONASE) 50 MCG/ACT nasal spray Place 2 sprays into both nostrils daily. 16 g 6   Lancets (ONETOUCH ULTRASOFT) lancets Use to check blood sugar twice a day 200 each 12   metFORMIN (GLUCOPHAGE) 500 MG tablet TAKE 2 TABLETS BY MOUTH  TWICE DAILY WITH A MEAL 360 tablet 3    omeprazole (PRILOSEC) 20 MG capsule TAKE 1 CAPSULE BY MOUTH  DAILY 90 capsule 3   ondansetron (ZOFRAN ODT) 4 MG disintegrating tablet Take 1 tablet (4 mg total) by mouth every 8 (eight) hours as needed for nausea or vomiting. 30 tablet 0   ONETOUCH ULTRA test strip Use to check blood sugar twice a day 200 each 12   rosuvastatin (CRESTOR) 20 MG tablet TAKE 1 TABLET BY MOUTH AT  BEDTIME 90 tablet 1   sertraline (ZOLOFT) 100 MG tablet Take 1 tablet (100 mg total) by mouth daily. 90 tablet 3   SUMAtriptan (IMITREX) 25 MG tablet Take 1-2 tablets (25-50 mg total) by mouth once as needed for up to 1 dose for migraine. May repeat dose in 2 hr if HA persists = max 24 hr 12 tablet 1   traZODone (DESYREL) 100 MG tablet Take 1 tablet (100 mg total) by mouth at bedtime. 90 tablet 3   TRULICITY 4.5 NA/3.5TD SOPN Inject 4.5 mg as directed once a week. 6 mL 3   No current facility-administered medications for this visit.    Allergies  Allergen Reactions   Aspirin Hives   Nsaids Hives    Family History  Problem Relation Age of Onset   Diabetes Mother    Prostate cancer Father    Hypertension Sister     Social History   Socioeconomic History   Marital status: Married    Spouse  name: Not on file   Number of children: Not on file   Years of education: Not on file   Highest education level: Not on file  Occupational History   Not on file  Tobacco Use   Smoking status: Former    Packs/day: 0.50    Years: 30.00    Pack years: 15.00    Types: Cigarettes   Smokeless tobacco: Former  Scientific laboratory technician Use: Every day  Substance and Sexual Activity   Alcohol use: No    Comment: Abstaining from alcohol for 15 years, prior history in age 76s   Drug use: No   Sexual activity: Not on file  Other Topics Concern   Not on file  Social History Narrative   Not on file   Social Determinants of Health   Financial Resource Strain: Not on file  Food Insecurity: Not on file  Transportation  Needs: Not on file  Physical Activity: Not on file  Stress: Not on file  Social Connections: Not on file  Intimate Partner Violence: Not on file     Constitutional: Pt reports fever, chills and body aches. Denies headache or abrupt weight changes.  HEENT: Pt reports nasal congestion, sore throat. Denies eye pain, eye redness, ear pain, ringing in the ears, wax buildup, runny nose, bloody nose Respiratory: Pt reports cough. Denies difficulty breathing, shortness of breath.   Cardiovascular: Denies chest pain, chest tightness, palpitations or swelling in the hands or feet.  Gastrointestinal: Denies abdominal pain, bloating, constipation, diarrhea or blood in the stool.   No other specific complaints in a complete review of systems (except as listed in HPI above).    Observations/Objective:  There were no vitals taken for this visit. Wt Readings from Last 3 Encounters:  03/16/21 240 lb 12.8 oz (109.2 kg)  07/23/20 244 lb 3.2 oz (110.8 kg)  03/19/20 238 lb (108 kg)    General: In NAD. HEENT: Nose: congestion noted; Throat/Mouth: hoarseness noted Pulmonary/Chest: Normal effort. No respiratory distress.  Neurological: Alert and oriented.   BMET    Component Value Date/Time   NA 134 (L) 03/16/2021 1014   NA 138 09/16/2016 1030   K 4.3 03/16/2021 1014   CL 95 (L) 03/16/2021 1014   CO2 25 03/16/2021 1014   GLUCOSE 421 (H) 03/16/2021 1014   BUN 8 03/16/2021 1014   BUN 9 09/16/2016 1030   CREATININE 0.84 03/16/2021 1014   CALCIUM 10.4 (H) 03/16/2021 1014   GFRNONAA 109 07/16/2020 0758   GFRAA 127 07/16/2020 0758    Lipid Panel     Component Value Date/Time   CHOL 87 03/10/2020 0815   CHOL 187 09/16/2016 1030   TRIG 153 (H) 03/10/2020 0815   HDL 27 (L) 03/10/2020 0815   HDL 31 (L) 09/16/2016 1030   CHOLHDL 3.2 03/10/2020 0815   LDLCALC 36 03/10/2020 0815    CBC    Component Value Date/Time   WBC 8.0 12/05/2019 0848   RBC 5.45 12/05/2019 0848   HGB 15.9 12/05/2019  0848   HGB 15.0 09/16/2016 1030   HCT 47.6 12/05/2019 0848   HCT 42.8 09/16/2016 1030   PLT 223 12/05/2019 0848   PLT 296 09/16/2016 1030   MCV 87.3 12/05/2019 0848   MCV 83 09/16/2016 1030   MCH 29.2 12/05/2019 0848   MCHC 33.4 12/05/2019 0848   RDW 12.5 12/05/2019 0848   RDW 13.1 09/16/2016 1030   LYMPHSABS 2,920 12/05/2019 0848   LYMPHSABS 3.4 (H) 09/16/2016 1030  MONOABS 1.4 (H) 02/03/2016 1150   EOSABS 200 12/05/2019 0848   EOSABS 0.3 09/16/2016 1030   BASOSABS 88 12/05/2019 0848   BASOSABS 0.1 09/16/2016 1030    Hgb A1C Lab Results  Component Value Date   HGBA1C 12.3 (H) 03/16/2021       Assessment and Plan:  Nasal Congestion, Sore Throat, Cough, Fever, Chills and Body Aches:  Will have him come for [facility] flu testing Advised him to take a home covid test Encouraged rest and fluids Ok to continue Dayquil OTC  Would want to avoid steriods given his history of uncontrolled diabetes RX for Paxlovid BID x 5 days- hold cholesterol medication  Will follow up after labs with further recommendation and treatment plan  Follow Up Instructions:    I discussed the assessment and treatment plan with the patient. The patient was provided an opportunity to ask questions and all were answered. The patient agreed with the plan and demonstrated an understanding of the instructions.   The patient was advised to call back or seek an in-person evaluation if the symptoms worsen or if the condition fails to improve as anticipated.    Curtis Silversmith, NP

## 2021-06-09 ENCOUNTER — Other Ambulatory Visit: Payer: Self-pay | Admitting: Family Medicine

## 2021-06-09 DIAGNOSIS — F5101 Primary insomnia: Secondary | ICD-10-CM

## 2021-06-09 DIAGNOSIS — F3342 Major depressive disorder, recurrent, in full remission: Secondary | ICD-10-CM

## 2021-06-10 NOTE — Telephone Encounter (Signed)
Requested Prescriptions  Pending Prescriptions Disp Refills   traZODone (DESYREL) 100 MG tablet [Pharmacy Med Name: traZODone HCl 100 MG Oral Tablet] 90 tablet 0    Sig: TAKE 1 TABLET BY MOUTH AT  BEDTIME     Psychiatry: Antidepressants - Serotonin Modulator Passed - 06/09/2021 10:22 PM      Passed - Completed PHQ-2 or PHQ-9 in the last 360 days      Passed - Valid encounter within last 6 months    Recent Outpatient Visits          1 week ago Nasal congestion   [facility] [place], [place] W, NP   2 months ago Type 2 diabetes mellitus with hyperglycemia, without long-term current use of insulin [facility])   [facility], Netta Neat, DO   10 months ago Type 2 diabetes mellitus with hyperglycemia, without long-term current use of insulin [facility])   [facility], Netta Neat, DO   1 year ago Type 2 diabetes mellitus with hyperglycemia, without long-term current use of insulin [facility])   [facility] [place] Smitty Cords, DO   1 year ago Type 2 diabetes mellitus with hyperglycemia, without long-term current use of insulin [facility])   [facility] [place], Netta Neat, DO

## 2021-08-17 ENCOUNTER — Other Ambulatory Visit: Payer: Self-pay | Admitting: Family Medicine

## 2021-08-17 DIAGNOSIS — E1165 Type 2 diabetes mellitus with hyperglycemia: Secondary | ICD-10-CM

## 2021-08-18 NOTE — Telephone Encounter (Signed)
Requested Prescriptions  ?Pending Prescriptions Disp Refills  ?? TRULICITY 4.5 MG/0.5ML SOPN [Pharmacy Med Name: Trulicity 4.5 MG/0.5ML Subcutaneous Solution Pen-injector] 6 mL 3  ?  Sig: INJECT THE CONTENTS OF ONE  PEN SUBCUTANEOUSLY WEEKLY  AS DIRECTED  ?  ? Endocrinology:  Diabetes - GLP-1 Receptor Agonists Failed - 08/17/2021 11:23 PM  ?  ?  Failed - HBA1C is between 0 and 7.9 and within 180 days  ?  Hgb A1c MFr Bld  ?Date Value Ref Range Status  ?03/16/2021 12.3 (H) <5.7 % of total Hgb Final  ?  Comment:  ?  For someone without known diabetes, a hemoglobin A1c ?value of 6.5% or greater indicates that they may have  ?diabetes and this should be confirmed with a follow-up  ?test. ?. ?For someone with known diabetes, a value <7% indicates  ?that their diabetes is well controlled and a value  ?greater than or equal to 7% indicates suboptimal  ?control. A1c targets should be individualized based on  ?duration of diabetes, age, comorbid conditions, and  ?other considerations. ?. ?Currently, no consensus exists regarding use of ?hemoglobin A1c for diagnosis of diabetes for children. ?. ?  ?   ?  ?  Passed - Valid encounter within last 6 months  ?  Recent Outpatient Visits   ?      ? 2 months ago Nasal congestion  ? [facility] [place], [place] W, NP  ? 5 months ago Type 2 diabetes mellitus with hyperglycemia, without long-term current use of insulin (HCC)  ? [facility] [place], Netta Neat, DO  ? 1 year ago Type 2 diabetes mellitus with hyperglycemia, without long-term current use of insulin (HCC)  ? [facility] [place], Netta Neat, DO  ? 1 year ago Type 2 diabetes mellitus with hyperglycemia, without long-term current use of insulin (HCC)  ? [facility] [place], Netta Neat, DO  ? 1 year ago Type 2 diabetes mellitus with hyperglycemia, without long-term current use of insulin (HCC)  ? [facility] Smitty Cords, DO  ?   ?  ? ?  ?  ?  ? ? ?

## 2021-08-31 ENCOUNTER — Other Ambulatory Visit: Payer: Self-pay | Admitting: Family Medicine

## 2021-08-31 DIAGNOSIS — E1169 Type 2 diabetes mellitus with other specified complication: Secondary | ICD-10-CM

## 2021-09-01 ENCOUNTER — Other Ambulatory Visit: Payer: Self-pay | Admitting: Family Medicine

## 2021-09-01 DIAGNOSIS — F3342 Major depressive disorder, recurrent, in full remission: Secondary | ICD-10-CM

## 2021-09-01 DIAGNOSIS — F5101 Primary insomnia: Secondary | ICD-10-CM

## 2021-09-01 NOTE — Telephone Encounter (Signed)
Requested Prescriptions  ?Pending Prescriptions Disp Refills  ?? traZODone (DESYREL) 100 MG tablet [Pharmacy Med Name: traZODone HCl 100 MG Oral Tablet] 90 tablet 0  ?  Sig: TAKE 1 TABLET BY MOUTH AT  BEDTIME  ?  ? Psychiatry: Antidepressants - Serotonin Modulator Passed - 09/01/2021  5:09 AM  ?  ?  Passed - Completed PHQ-2 or PHQ-9 in the last 360 days  ?  ?  Passed - Valid encounter within last 6 months  ?  Recent Outpatient Visits   ?      ? 3 months ago Nasal congestion  ? [facility] [place], [place], NP  ? 5 months ago Type 2 diabetes mellitus with hyperglycemia, without long-term current use of insulin (HCC)  ? [facility] [place], Netta Neat, DO  ? 1 year ago Type 2 diabetes mellitus with hyperglycemia, without long-term current use of insulin (HCC)  ? [facility] [place], Netta Neat, DO  ? 1 year ago Type 2 diabetes mellitus with hyperglycemia, without long-term current use of insulin (HCC)  ? [facility] [place], Netta Neat, DO  ? 1 year ago Type 2 diabetes mellitus with hyperglycemia, without long-term current use of insulin (HCC)  ? [facility] Smitty Cords, DO  ?  ?  ? ?  ?  ?  ? ? ?

## 2021-09-01 NOTE — Telephone Encounter (Signed)
Called pt to make appointment. Mail box is full. ?

## 2021-09-01 NOTE — Telephone Encounter (Signed)
Requested Prescriptions  ?Pending Prescriptions Disp Refills  ?? rosuvastatin (CRESTOR) 20 MG tablet [Pharmacy Med Name: Rosuvastatin Calcium 20 MG Oral Tablet] 90 tablet 3  ?  Sig: TAKE 1 TABLET BY MOUTH AT  BEDTIME  ?  ? Cardiovascular:  Antilipid - Statins 2 Failed - 08/31/2021 11:28 PM  ?  ?  Failed - Lipid Panel in normal range within the last 12 months  ?  Cholesterol, Total  ?Date Value Ref Range Status  ?09/16/2016 187 100 - 199 mg/dL Final  ? ?Cholesterol  ?Date Value Ref Range Status  ?03/10/2020 87 <200 mg/dL Final  ? ?LDL Cholesterol (Calc)  ?Date Value Ref Range Status  ?03/10/2020 36 mg/dL (calc) Final  ?  Comment:  ?  Reference range: <100 ?Marland Kitchen ?Desirable range <100 mg/dL for primary prevention;   ?<70 mg/dL for patients with CHD or diabetic patients  ?with > or = 2 CHD risk factors. ?. ?LDL-C is now calculated using the Martin-Hopkins  ?calculation, which is a validated novel method providing  ?better accuracy than the Friedewald equation in the  ?estimation of LDL-C.  ?Horald Pollen et al. Lenox Ahr. 2536;644(03): 2061-2068  ?(http://education.[employer].com/faq/FAQ164) ?  ? ?HDL  ?Date Value Ref Range Status  ?03/10/2020 27 (L) > OR = 40 mg/dL Final  ?47/42/5956 31 (L) >39 mg/dL Final  ? ?Triglycerides  ?Date Value Ref Range Status  ?03/10/2020 153 (H) <150 mg/dL Final  ? ?  ?  ?  Passed - Cr in normal range and within 360 days  ?  Creat  ?Date Value Ref Range Status  ?03/16/2021 0.84 0.60 - 1.29 mg/dL Final  ?   ?  ?  Passed - Patient is not pregnant  ?  ?  Passed - Valid encounter within last 12 months  ?  Recent Outpatient Visits   ?      ? 3 months ago Nasal congestion  ? [facility] [place], [place], NP  ? 5 months ago Type 2 diabetes mellitus with hyperglycemia, without long-term current use of insulin (HCC)  ? [facility] [place], Netta Neat, DO  ? 1 year ago Type 2 diabetes mellitus with hyperglycemia, without long-term current use of insulin (HCC)  ? [facility] [place], Netta Neat, DO  ? 1 year ago Type 2 diabetes mellitus with hyperglycemia, without long-term current use of insulin (HCC)  ? [facility] [place], Netta Neat, DO  ? 1 year ago Type 2 diabetes mellitus with hyperglycemia, without long-term current use of insulin (HCC)  ? [facility] Smitty Cords, DO  ?  ?  ? ?  ?  ?  ? ? ?

## 2021-09-08 ENCOUNTER — Encounter: Payer: Self-pay | Admitting: Family Medicine

## 2021-09-08 ENCOUNTER — Ambulatory Visit (INDEPENDENT_AMBULATORY_CARE_PROVIDER_SITE_OTHER): Payer: 59 | Admitting: Family Medicine

## 2021-09-08 VITALS — BP 144/80 | HR 92 | Ht 70.0 in | Wt 223.8 lb

## 2021-09-08 DIAGNOSIS — E1165 Type 2 diabetes mellitus with hyperglycemia: Secondary | ICD-10-CM | POA: Diagnosis not present

## 2021-09-08 DIAGNOSIS — F331 Major depressive disorder, recurrent, moderate: Secondary | ICD-10-CM | POA: Diagnosis not present

## 2021-09-08 DIAGNOSIS — F5101 Primary insomnia: Secondary | ICD-10-CM | POA: Diagnosis not present

## 2021-09-08 MED ORDER — RYBELSUS 3 MG PO TABS: 3.0000 mg | ORAL_TABLET | Freq: Every day | ORAL | 0 refills | Status: DC

## 2021-09-08 MED ORDER — ESCITALOPRAM OXALATE 10 MG PO TABS: 10.0000 mg | ORAL_TABLET | Freq: Every day | ORAL | 1 refills | Status: DC

## 2021-09-08 NOTE — Patient Instructions (Addendum)
Thank you for coming to the office today. ? ?Stop Trulicity due to side effects. ? ?I am pleased with the weight loss ?The appetite suppression was likely from Trulicity ?It can be also present with Rybelsus but may be less ? ?Rybelsus 3mg  daily is new med. Take 30 min before other medicines and meal. Notify office after 30 days will increase dose, new rx. ? ?Mychart message and I can order the 7mg  to the [facility] ?------------- ? ?Taper off Sertraline as discussed ?Start the Escitalopram 10mg  daily once you are off Sertraline. ?Goal to help mood / anxiety. ? ?Please schedule a Follow-up Appointment to: Return in about 3 months (around 12/08/2021) for 3 month updates Endocrinology, DM updates, Mood/Anxiety med. ? ?If you have any other questions or concerns, please feel free to call the office or send a message through MyChart. You may also schedule an earlier appointment if necessary. ? ?Additionally, you may be receiving a survey about your experience at our office within a few days to 1 week by e-mail or mail. We value your feedback. ? ? , DO ?[facility], 12/10/2021 ?

## 2021-09-08 NOTE — Progress Notes (Signed)
? ?Subjective:  ? ? Patient ID: Curtis Parrish, male    DOB: Jul 01, 1980, 41 y.o.   MRN: [id number] ? ?Curtis Parrish is a 41 y.o. male presenting on 09/08/2021 for Diabetes and Depression ? ? ?HPI ? ?TYPE 2 DIABETES / Hyperglycemia ?Hyperlipidemia / Obesity BMI >32 ?Previous A1c down to 9.3 in March 2022 ?He has been on highest dose Trulicity 4.5mg  weekly not seeing improvement. Has had intolerance with variety of symptoms see below. ?CBG: 200-275 - improved prior >300 ?He is still due for DM Eye exam has to schedule ?He admits improving lifestyle, goal to improve, reducing appetite. ?Meds: Metformin 1000mg  BID, Trulicity 4.5 mg weekly inj ?Not on ACEi/ARB ?Denies hypoglycemia, polyuria, visual changes, numbness or tingling. ? ?Upcoming Endocrinology apt scheduled next month May 2023. ? ?Admits dizzy spells, palpitations with rapid heart rate at times. ?Sporadic episodes. ?  ?Major Recurrent depression, in remission ?Insomnia ?Previously doing well on SSRI and Trazodone. He takes Trazodone PRN now not nightly. ?He feels like a "zombie" now on Sertraline ?- He failed Fluoxetine due to Migraine side effect in the past, also failed Wellbutrin ?  ?He is interested in Zoloft dose adjustment or change. ?  ?Still feels some nights not sleeping as well, dose on Trazodone 50mg  has tried 100mg  PRN with good result. ?Denies suicidal or homicidal ideation, or worsening anxiety ? ? ? ? ?  09/08/2021  ? 11:13 AM 03/16/2021  ?  9:32 AM 07/23/2020  ?  8:37 AM  ?Depression screen PHQ 2/9  ?Decreased Interest 1 3 1   ?Down, Depressed, Hopeless 1 2 1   ?PHQ - 2 Score 2 5 2   ?Altered sleeping 0 0 2  ?Tired, decreased energy 2 2 2   ?Change in appetite 2 2 2   ?Feeling bad or failure about yourself  1 0 1  ?Trouble concentrating 0 0 1  ?Moving slowly or fidgety/restless 0 0 0  ?Suicidal thoughts 0 0 0  ?PHQ-9 Score 7 9 10   ?Difficult doing work/chores Somewhat difficult Somewhat difficult Somewhat difficult  ? ? ?  09/08/2021  ? 11:13 AM 03/16/2021   ?  9:33 AM 10/29/2019  ?  1:49 PM 09/15/2016  ? 10:20 AM  ?GAD 7 : Generalized Anxiety Score  ?Nervous, Anxious, on Edge 1 1 2 2   ?Control/stop worrying 1 1 1 1   ?Worry too much - different things 1 1 1 2   ?Trouble relaxing 1 1 2 2   ?Restless 1 1 1 1   ?Easily annoyed or irritable 1 1 1 2   ?Afraid - awful might happen 0 1 1 1   ?Total GAD 7 Score 6 7 9 11   ?Anxiety Difficulty Somewhat difficult Somewhat difficult Somewhat difficult Somewhat difficult  ? ? ? ? ?Social History  ? ?Tobacco Use  ? Smoking status: Former  ?  Packs/day: 0.50  ?  Years: 30.00  ?  Pack years: 15.00  ?  Types: Cigarettes  ? Smokeless tobacco: Former  ?Vaping Use  ? Vaping Use: Every day  ?Substance Use Topics  ? Alcohol use: No  ?  Comment: Abstaining from alcohol for 15 years, prior history in age 37s  ? Drug use: No  ? ? ?Review of Systems ?Per HPI unless specifically indicated above ? ?   ?Objective:  ?  ?BP (!) 144/80   Pulse 92   Ht 5\' 10"  (1.778 m)   Wt 223 lb 12.8 oz (101.5 kg)   SpO2 99%   BMI 32.11 kg/m?   ?Wt Readings  from Last 3 Encounters:  ?09/08/21 223 lb 12.8 oz (101.5 kg)  ?03/16/21 240 lb 12.8 oz (109.2 kg)  ?07/23/20 244 lb 3.2 oz (110.8 kg)  ?  ?Physical Exam ?Vitals and nursing note reviewed.  ?Constitutional:   ?   General: He is not in acute distress. ?   Appearance: Normal appearance. He is well-developed. He is obese. He is not diaphoretic.  ?   Comments: Well-appearing, comfortable, cooperative  ?HENT:  ?   Head: Normocephalic and atraumatic.  ?Eyes:  ?   General:     ?   Right eye: No discharge.     ?   Left eye: No discharge.  ?   Conjunctiva/sclera: Conjunctivae normal.  ?Cardiovascular:  ?   Rate and Rhythm: Normal rate.  ?Pulmonary:  ?   Effort: Pulmonary effort is normal.  ?Skin: ?   General: Skin is warm and dry.  ?   Findings: No erythema or rash.  ?Neurological:  ?   Mental Status: He is alert and oriented to person, place, and time.  ?Psychiatric:     ?   Mood and Affect: Mood normal.     ?   Behavior:  Behavior normal.     ?   Thought Content: Thought content normal.  ?   Comments: Well groomed, good eye contact, normal speech and thoughts  ? ? ? ?Results for orders placed or performed in visit on 05/28/21  ?POCT Influenza A/B  ?Result Value Ref Range  ? Influenza A, POC Negative Negative  ? Influenza B, POC Negative Negative  ? ?   ?Assessment & Plan:  ? ?Problem List Items Addressed This Visit   ? ? Type 2 diabetes mellitus with hyperglycemia (HCC) - Primary  ? Relevant Medications  ? RYBELSUS 3 MG TABS  ? Primary insomnia  ? Major depressive disorder, recurrent episode (HCC)  ? Relevant Medications  ? escitalopram (LEXAPRO) 10 MG tablet  ?  ?Stop Trulicity due to side effects. Intolerance but had good appetite suppression wt loss ? ?Rybelsus 3mg  daily is new med. Take 30 min before other medicines and meal. Notify office after 30 days will increase dose, new rx. ? ?Mychart message in 30 days and I can order the 7mg  to the [facility] ? ?Keep up with Endocrinology as scheduled for new visit within 1 month, they can assist in management of Diabetes going forward since mixed results on GLP1 therapy. ?------------- ? ?Taper off Sertraline as discussed with ineffective response ?Start the Escitalopram 10mg  daily once you are off Sertraline. ?Goal to help mood / anxiety. ? ? ? ?Meds ordered this encounter  ?Medications  ? RYBELSUS 3 MG TABS  ?  Sig: Take 3 mg by mouth daily before breakfast. Take 30 min before other medicines and meal. Notify office after 30 days will increase dose, new rx.  ?  Dispense:  30 tablet  ?  Refill:  0  ? escitalopram (LEXAPRO) 10 MG tablet  ?  Sig: Take 1 tablet (10 mg total) by mouth daily.  ?  Dispense:  90 tablet  ?  Refill:  1  ? ? ? ?Follow up plan: ?Return in about 3 months (around 12/08/2021) for 3 month updates Endocrinology, DM updates, Mood/Anxiety med. ? ? ? , DO ?[facility] [place] ?[facility] ?09/08/2021, 11:16 AM ?

## 2021-10-05 ENCOUNTER — Encounter: Payer: Self-pay | Admitting: Family Medicine

## 2021-10-05 DIAGNOSIS — E1165 Type 2 diabetes mellitus with hyperglycemia: Secondary | ICD-10-CM

## 2021-10-06 MED ORDER — RYBELSUS 7 MG PO TABS: 7.0000 mg | ORAL_TABLET | Freq: Every day | ORAL | 1 refills | Status: DC

## 2021-11-24 ENCOUNTER — Other Ambulatory Visit: Payer: Self-pay | Admitting: Family Medicine

## 2021-11-24 DIAGNOSIS — F3342 Major depressive disorder, recurrent, in full remission: Secondary | ICD-10-CM

## 2021-11-24 DIAGNOSIS — F5101 Primary insomnia: Secondary | ICD-10-CM

## 2021-11-25 NOTE — Telephone Encounter (Signed)
Requested medication (s) are due for refill today: yes  Requested medication (s) are on the active medication list: yes   Last refill:  09/01/21 #90 0 refills  Future visit scheduled: no  Notes to clinic:  requesting 1 year supply . Do you want to refill for 1 year?     Requested Prescriptions  Pending Prescriptions Disp Refills   traZODone (DESYREL) 100 MG tablet [Pharmacy Med Name: traZODone HCl 100 MG Oral Tablet] 90 tablet 3    Sig: TAKE 1 TABLET BY MOUTH AT  BEDTIME     Psychiatry: Antidepressants - Serotonin Modulator Passed - 11/24/2021 11:03 PM      Passed - Completed PHQ-2 or PHQ-9 in the last 360 days      Passed - Valid encounter within last 6 months    Recent Outpatient Visits           2 months ago Type 2 diabetes mellitus with hyperglycemia, without long-term current use of insulin [facility])   [facility] Smitty Cords, DO   6 months ago Nasal congestion   [facility] [place], [place], NP   8 months ago Type 2 diabetes mellitus with hyperglycemia, without long-term current use of insulin [facility])   [facility], Netta Neat, DO   1 year ago Type 2 diabetes mellitus with hyperglycemia, without long-term current use of insulin [facility])   [facility] Smitty Cords, DO   1 year ago Type 2 diabetes mellitus with hyperglycemia, without long-term current use of insulin [facility])   [facility] [place], Netta Neat, DO

## 2022-02-02 ENCOUNTER — Other Ambulatory Visit: Payer: Self-pay | Admitting: Family Medicine

## 2022-02-02 DIAGNOSIS — E1165 Type 2 diabetes mellitus with hyperglycemia: Secondary | ICD-10-CM

## 2022-02-02 DIAGNOSIS — I10 Essential (primary) hypertension: Secondary | ICD-10-CM

## 2022-02-03 NOTE — Telephone Encounter (Signed)
Requested medication (s) are due for refill today: yes (mail order)  Requested medication (s) are on the active medication list: yes  Last refill:  03/05/2021 #360 with 3 RF  Future visit scheduled: no, seen 09/08/21  Notes to clinic:  Failed protocol of labs within 12 months, CBC from 2021, no upcoming appt, please assess.       Requested Prescriptions  Pending Prescriptions Disp Refills   metFORMIN (GLUCOPHAGE) 500 MG tablet [Pharmacy Med Name: metFORMIN HCl 500 MG Oral Tablet] 360 tablet 3    Sig: TAKE 2 TABLETS BY MOUTH  TWICE DAILY WITH A MEAL     Endocrinology:  Diabetes - Biguanides Failed - 02/02/2022 10:01 PM      Failed - HBA1C is between 0 and 7.9 and within 180 days    Hgb A1c MFr Bld  Date Value Ref Range Status  03/16/2021 12.3 (H) <5.7 % of total Hgb Final    Comment:    For someone without known diabetes, a hemoglobin A1c value of 6.5% or greater indicates that they may have  diabetes and this should be confirmed with a follow-up  test. . For someone with known diabetes, a value <7% indicates  that their diabetes is well controlled and a value  greater than or equal to 7% indicates suboptimal  control. A1c targets should be individualized based on  duration of diabetes, age, comorbid conditions, and  other considerations. . Currently, no consensus exists regarding use of hemoglobin A1c for diagnosis of diabetes for children. .          Failed - B12 Level in normal range and within 720 days    No results found for: "VITAMINB12"       Failed - CBC within normal limits and completed in the last 12 months    WBC  Date Value Ref Range Status  12/05/2019 8.0 3.8 - 10.8 Thousand/uL Final   RBC  Date Value Ref Range Status  12/05/2019 5.45 4.20 - 5.80 Million/uL Final   Hemoglobin  Date Value Ref Range Status  12/05/2019 15.9 13.2 - 17.1 g/dL Final  09/16/2016 15.0 13.0 - 17.7 g/dL Final   HCT  Date Value Ref Range Status  12/05/2019 47.6 38.5 -  50.0 % Final   Hematocrit  Date Value Ref Range Status  09/16/2016 42.8 37.5 - 51.0 % Final   MCHC  Date Value Ref Range Status  12/05/2019 33.4 32.0 - 36.0 g/dL Final   [facility]  Date Value Ref Range Status  12/05/2019 29.2 27.0 - 33.0 pg Final   MCV  Date Value Ref Range Status  12/05/2019 87.3 80.0 - 100.0 fL Final  09/16/2016 83 79 - 97 fL Final   No results found for: "PLTCOUNTKUC", "LABPLAT", "POCPLA" RDW  Date Value Ref Range Status  12/05/2019 12.5 11.0 - 15.0 % Final  09/16/2016 13.1 12.3 - 15.4 % Final         Passed - Cr in normal range and within 360 days    Creat  Date Value Ref Range Status  03/16/2021 0.84 0.60 - 1.29 mg/dL Final         Passed - eGFR in normal range and within 360 days    GFR, Est African American  Date Value Ref Range Status  07/16/2020 127 > OR = 60 mL/min/1.25m2 Final   GFR, Est Non African American  Date Value Ref Range Status  07/16/2020 109 > OR = 60 mL/min/1.31m2 Final   eGFR  Date Value Ref  Range Status  03/16/2021 112 > OR = 60 mL/min/1.42m2 Final    Comment:    The eGFR is based on the CKD-EPI 2021 equation. To calculate  the new eGFR from a previous Creatinine or Cystatin C result, go to https://www.kidney.org/professionals/ kdoqi/gfr%5Fcalculator          Passed - Valid encounter within last 6 months    Recent Outpatient Visits           4 months ago Type 2 diabetes mellitus with hyperglycemia, without long-term current use of insulin ([place])   [place], DO   8 months ago Nasal congestion   [facility] [place], [place] W, NP   10 months ago Type 2 diabetes mellitus with hyperglycemia, without long-term current use of insulin [facility])   [facility], Devonne Doughty, DO   1 year ago Type 2 diabetes mellitus with hyperglycemia, without long-term current use of insulin ([place])   [facility] Olin Hauser, DO   1 year  ago Type 2 diabetes mellitus with hyperglycemia, without long-term current use of insulin ([place])   [facility], Devonne Doughty, DO              Signed Prescriptions Disp Refills   amLODipine (NORVASC) 10 MG tablet 90 tablet 0    Sig: TAKE 1 TABLET BY MOUTH DAILY     Cardiovascular: Calcium Channel Blockers 2 Failed - 02/02/2022 10:01 PM      Failed - Last BP in normal range    BP Readings from Last 1 Encounters:  09/08/21 (!) 144/80         Passed - Last Heart Rate in normal range    Pulse Readings from Last 1 Encounters:  09/08/21 92         Passed - Valid encounter within last 6 months    Recent Outpatient Visits           4 months ago Type 2 diabetes mellitus with hyperglycemia, without long-term current use of insulin ([place])   [place], Devonne Doughty, DO   8 months ago Nasal congestion   [facility] [place], [place] W, NP   10 months ago Type 2 diabetes mellitus with hyperglycemia, without long-term current use of insulin [facility])   [facility], Devonne Doughty, DO   1 year ago Type 2 diabetes mellitus with hyperglycemia, without long-term current use of insulin [facility])   [facility], Devonne Doughty, DO   1 year ago Type 2 diabetes mellitus with hyperglycemia, without long-term current use of insulin [facility])   [place], Devonne Doughty, DO

## 2022-02-03 NOTE — Telephone Encounter (Signed)
Requested Prescriptions  Pending Prescriptions Disp Refills  . metFORMIN (GLUCOPHAGE) 500 MG tablet [Pharmacy Med Name: metFORMIN HCl 500 MG Oral Tablet] 360 tablet 3    Sig: TAKE 2 TABLETS BY MOUTH  TWICE DAILY WITH A MEAL     Endocrinology:  Diabetes - Biguanides Failed - 02/02/2022 10:01 PM      Failed - HBA1C is between 0 and 7.9 and within 180 days    Hgb A1c MFr Bld  Date Value Ref Range Status  03/16/2021 12.3 (H) <5.7 % of total Hgb Final    Comment:    For someone without known diabetes, a hemoglobin A1c value of 6.5% or greater indicates that they may have  diabetes and this should be confirmed with a follow-up  test. . For someone with known diabetes, a value <7% indicates  that their diabetes is well controlled and a value  greater than or equal to 7% indicates suboptimal  control. A1c targets should be individualized based on  duration of diabetes, age, comorbid conditions, and  other considerations. . Currently, no consensus exists regarding use of hemoglobin A1c for diagnosis of diabetes for children. .          Failed - B12 Level in normal range and within 720 days    No results found for: "VITAMINB12"       Failed - CBC within normal limits and completed in the last 12 months    WBC  Date Value Ref Range Status  12/05/2019 8.0 3.8 - 10.8 Thousand/uL Final   RBC  Date Value Ref Range Status  12/05/2019 5.45 4.20 - 5.80 Million/uL Final   Hemoglobin  Date Value Ref Range Status  12/05/2019 15.9 13.2 - 17.1 g/dL Final  09/40/0050 56.7 13.0 - 17.7 g/dL Final   HCT  Date Value Ref Range Status  12/05/2019 47.6 38.5 - 50.0 % Final   Hematocrit  Date Value Ref Range Status  09/16/2016 42.8 37.5 - 51.0 % Final   MCHC  Date Value Ref Range Status  12/05/2019 33.4 32.0 - 36.0 g/dL Final   [facility]  Date Value Ref Range Status  12/05/2019 29.2 27.0 - 33.0 pg Final   MCV  Date Value Ref Range Status  12/05/2019 87.3 80.0 - 100.0 fL Final  09/16/2016 83  79 - 97 fL Final   No results found for: "PLTCOUNTKUC", "LABPLAT", "POCPLA" RDW  Date Value Ref Range Status  12/05/2019 12.5 11.0 - 15.0 % Final  09/16/2016 13.1 12.3 - 15.4 % Final         Passed - Cr in normal range and within 360 days    Creat  Date Value Ref Range Status  03/16/2021 0.84 0.60 - 1.29 mg/dL Final         Passed - eGFR in normal range and within 360 days    GFR, Est African American  Date Value Ref Range Status  07/16/2020 127 > OR = 60 mL/min/1.54m2 Final   GFR, Est Non African American  Date Value Ref Range Status  07/16/2020 109 > OR = 60 mL/min/1.86m2 Final   eGFR  Date Value Ref Range Status  03/16/2021 112 > OR = 60 mL/min/1.28m2 Final    Comment:    The eGFR is based on the CKD-EPI 2021 equation. To calculate  the new eGFR from a previous Creatinine or Cystatin C result, go to https://www.kidney.org/professionals/ kdoqi/gfr%5Fcalculator          Passed - Valid encounter within last 6 months  Recent Outpatient Visits          4 months ago Type 2 diabetes mellitus with hyperglycemia, without long-term current use of insulin ([place])   [place], DO   8 months ago Nasal congestion   [facility] [place], [place] W, NP   10 months ago Type 2 diabetes mellitus with hyperglycemia, without long-term current use of insulin ([place])   [facility], DO   1 year ago Type 2 diabetes mellitus with hyperglycemia, without long-term current use of insulin ([place])   [facility], DO   1 year ago Type 2 diabetes mellitus with hyperglycemia, without long-term current use of insulin ([place])   [facility], Devonne Doughty, DO             . amLODipine (NORVASC) 10 MG tablet [Pharmacy Med Name: amLODIPine Besylate 10 MG Oral Tablet] 90 tablet 0    Sig: TAKE 1 TABLET BY MOUTH DAILY     Cardiovascular: Calcium  Channel Blockers 2 Failed - 02/02/2022 10:01 PM      Failed - Last BP in normal range    BP Readings from Last 1 Encounters:  09/08/21 (!) 144/80         Passed - Last Heart Rate in normal range    Pulse Readings from Last 1 Encounters:  09/08/21 92         Passed - Valid encounter within last 6 months    Recent Outpatient Visits          4 months ago Type 2 diabetes mellitus with hyperglycemia, without long-term current use of insulin ([place])   [place], Devonne Doughty, DO   8 months ago Nasal congestion   [facility] [place], [place] W, NP   10 months ago Type 2 diabetes mellitus with hyperglycemia, without long-term current use of insulin [facility])   [facility], Devonne Doughty, DO   1 year ago Type 2 diabetes mellitus with hyperglycemia, without long-term current use of insulin [facility])   [place], Devonne Doughty, DO   1 year ago Type 2 diabetes mellitus with hyperglycemia, without long-term current use of insulin [facility])   [place], Devonne Doughty, DO

## 2022-03-09 ENCOUNTER — Other Ambulatory Visit: Payer: Self-pay | Admitting: Family Medicine

## 2022-03-09 DIAGNOSIS — E1165 Type 2 diabetes mellitus with hyperglycemia: Secondary | ICD-10-CM

## 2022-03-09 DIAGNOSIS — F331 Major depressive disorder, recurrent, moderate: Secondary | ICD-10-CM

## 2022-03-10 NOTE — Telephone Encounter (Signed)
Requested medication (s) are due for refill today: yes  Requested medication (s) are on the active medication list: yes  Last refill:  10/06/21  Future visit scheduled: no  Notes to clinic:  Medication not assigned to a protocol, review manually.     Requested Prescriptions  Pending Prescriptions Disp Refills   RYBELSUS 7 MG TABS [Pharmacy Med Name: Rybelsus 7 MG Oral Tablet] 90 tablet 3    Sig: TAKE 1 TABLET BY MOUTH DAILY  BEFORE BREAKFAST 1/2 HOUR BEFORE OTHER MEDICINES AND MEAL     Off-Protocol Failed - 03/09/2022  6:58 AM      Failed - Medication not assigned to a protocol, review manually.      Passed - Valid encounter within last 12 months    Recent Outpatient Visits           6 months ago Type 2 diabetes mellitus with hyperglycemia, without long-term current use of insulin ([place])   [facility] O[facility], DO   9 months ago Nasal congestion   [facility] [place], [place] W, NP   11 months ago Type 2 diabetes mellitus with hyperglycemia, without long-term current use of insulin [facility])   [facility] Olin Hauser, DO   1 year ago Type 2 diabetes mellitus with hyperglycemia, without long-term current use of insulin ([place])   [facility] Olin Hauser, DO   1 year ago Type 2 diabetes mellitus with hyperglycemia, without long-term current use of insulin ([place])   [facility], Devonne Doughty, DO               escitalopram (LEXAPRO) 10 MG tablet [Pharmacy Med Name: Escitalopram Oxalate 10 MG Oral Tablet] 90 tablet 3    Sig: TAKE 1 TABLET BY MOUTH DAILY     Psychiatry:  Antidepressants - SSRI Failed - 03/09/2022  6:58 AM      Failed - Valid encounter within last 6 months    Recent Outpatient Visits           6 months ago Type 2 diabetes mellitus with hyperglycemia, without long-term current use of insulin [facility])   [facility] O[facility], DO   9 months ago Nasal congestion   [facility] [place], [place] W, NP   11 months ago Type 2 diabetes mellitus with hyperglycemia, without long-term current use of insulin [facility])   [facility], Devonne Doughty, DO   1 year ago Type 2 diabetes mellitus with hyperglycemia, without long-term current use of insulin [facility])   [facility], Devonne Doughty, DO   1 year ago Type 2 diabetes mellitus with hyperglycemia, without long-term current use of insulin [facility])   [place], DO              Passed - Completed PHQ-2 or PHQ-9 in the last 360 days

## 2022-03-11 IMAGING — US US ABDOMEN LIMITED
1 series · 14 of 25 positions shown · non-contrast
Comparison: Right upper quadrant ultrasound 04/18/2017

CLINICAL DATA: Right upper quadrant and generalized postprandial
abdominal pain. Nausea and vomiting.

EXAM:
ULTRASOUND ABDOMEN LIMITED RIGHT UPPER QUADRANT

[Series 1: us abdomen limited ruq (liver/gb) · 68 acquisitions, 14 frames shown]
[im 1/68]
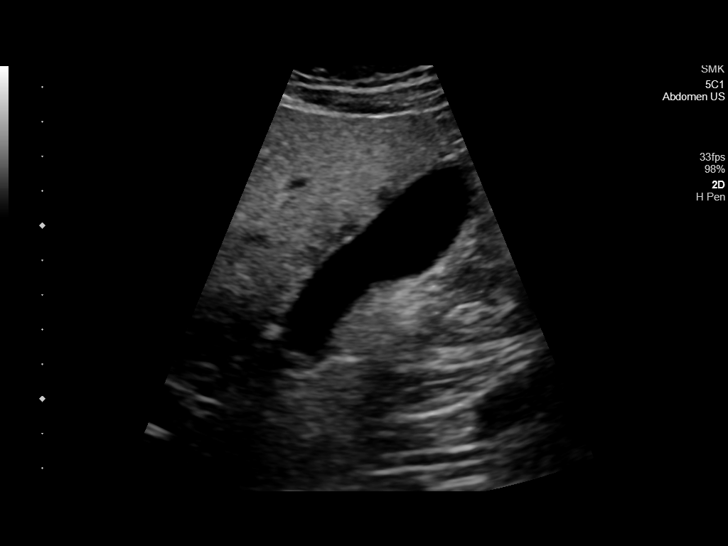
[im 6/68]
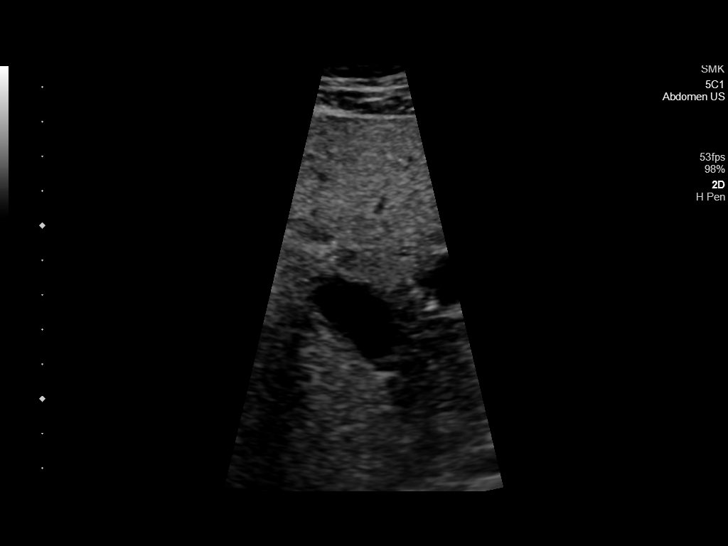
[im 12/68]
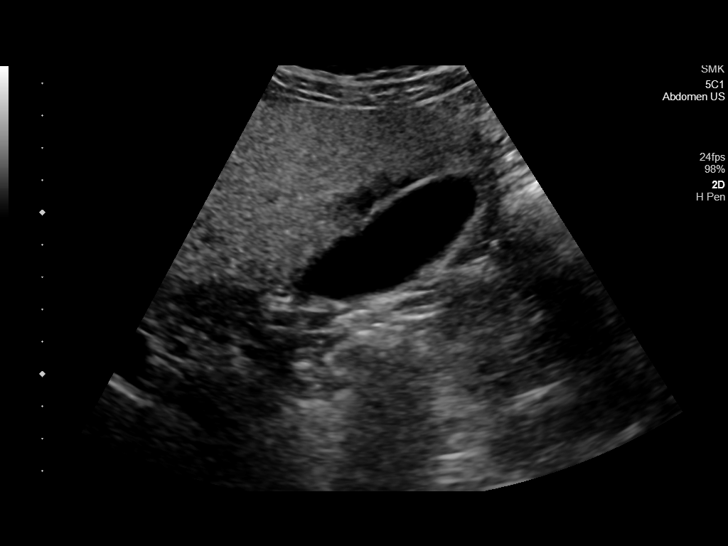
[im 17/68]
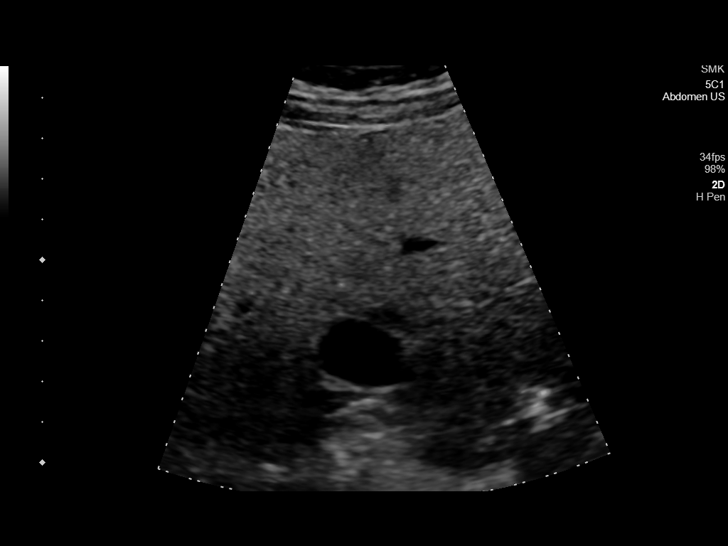
[im 23/68]
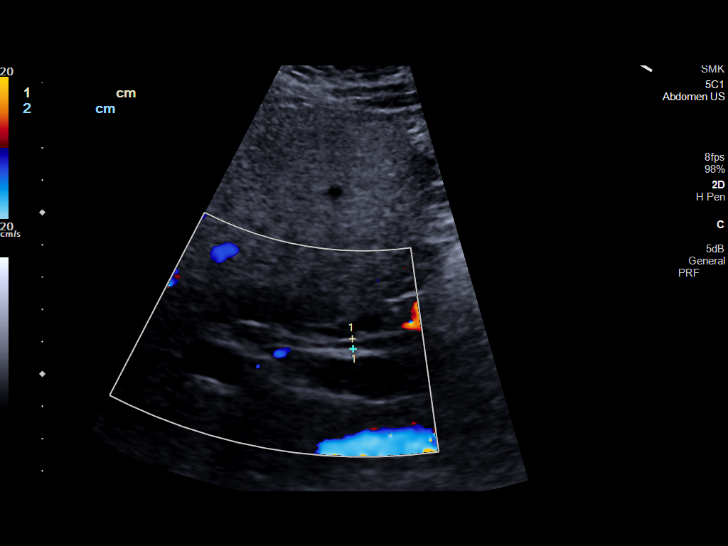
[im 26/68]
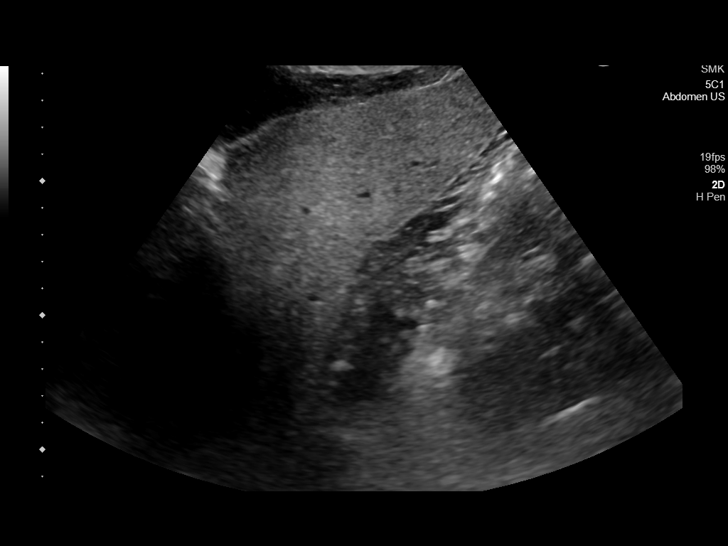
[im 31/68]
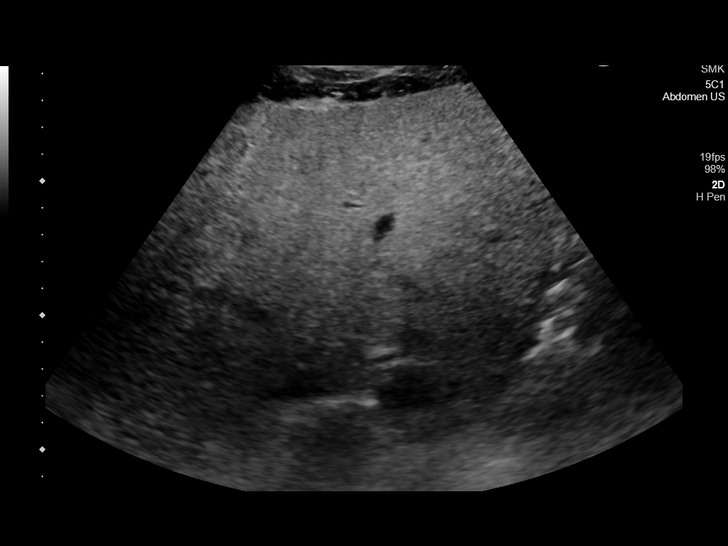
[im 37/68]
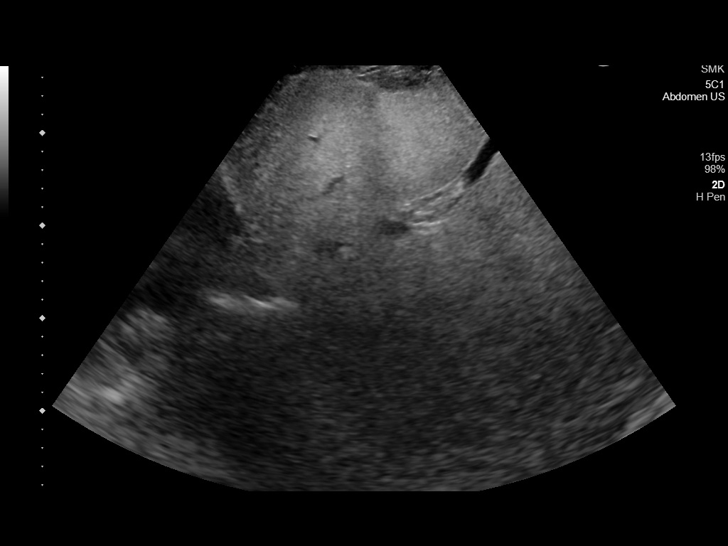
[im 42/68]
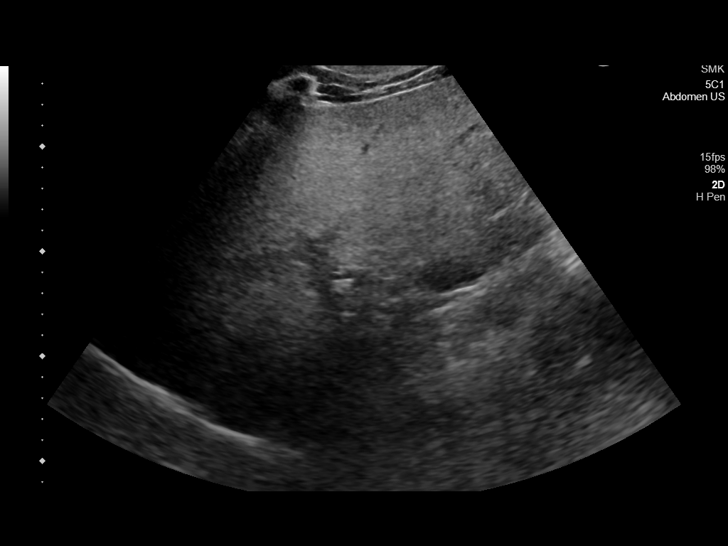
[im 45/68]
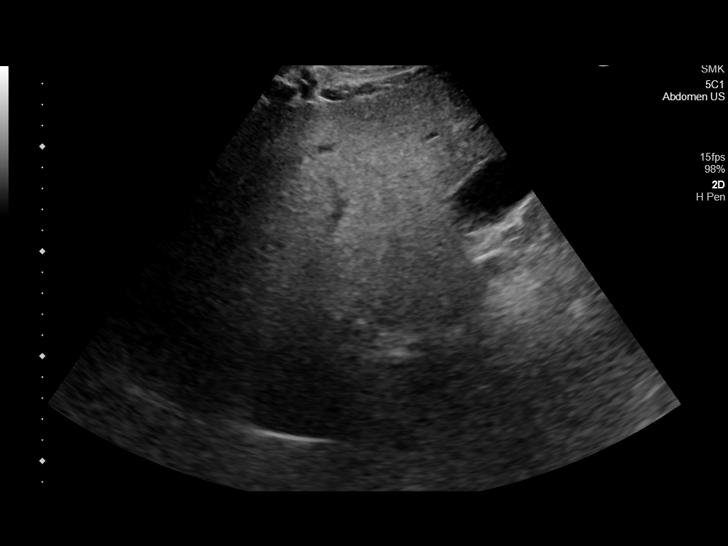
[im 51/68]
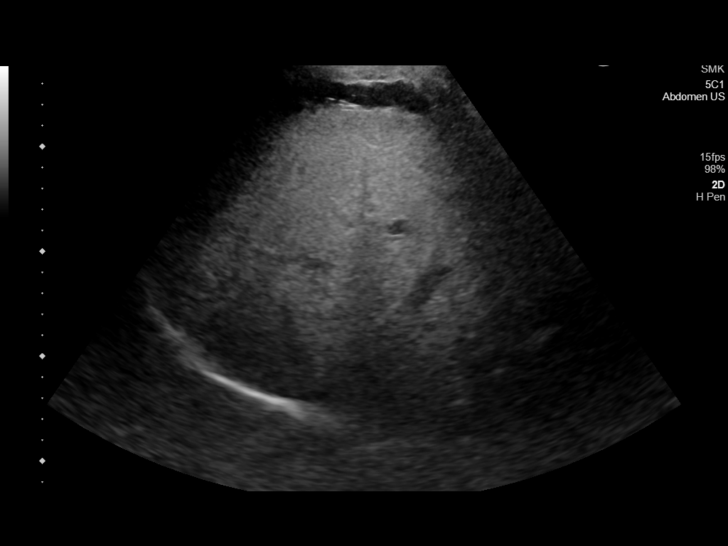
[im 56/68]
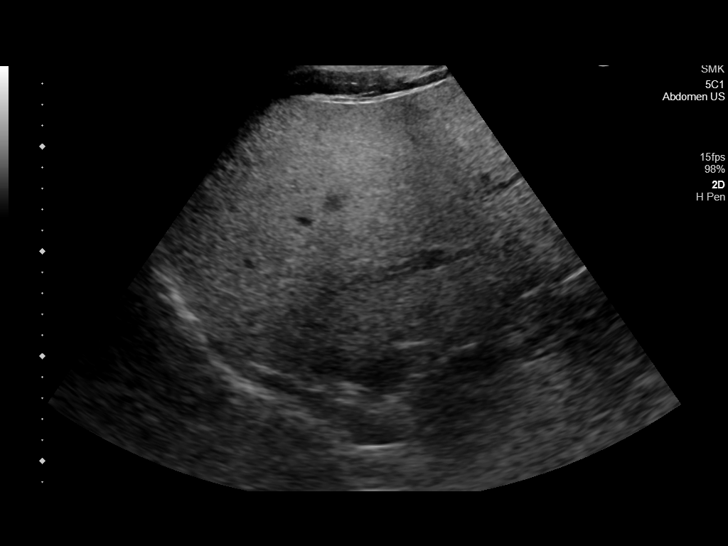
[im 62/68]
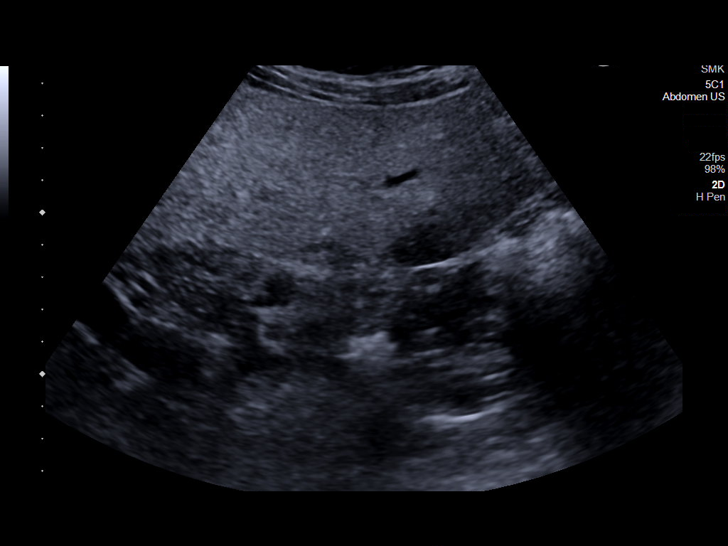
[im 68/68]
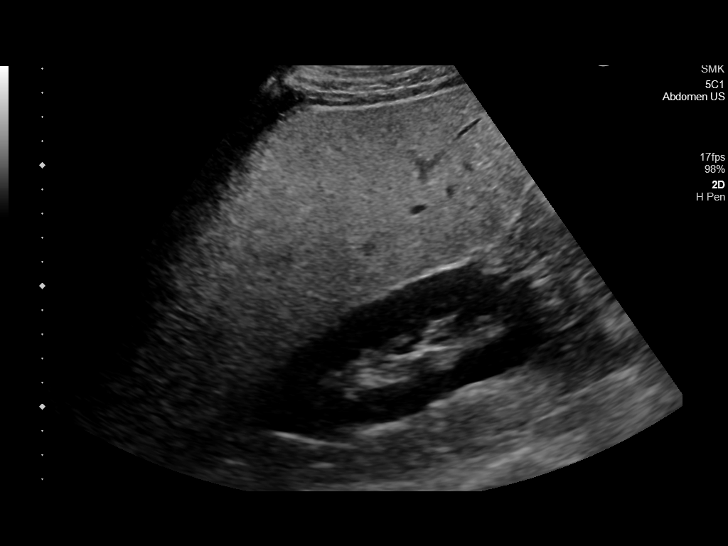

[14 of 25 positions shown; findings below may reference images not displayed]

FINDINGS: Gallbladder:

Physiologically distended. No gallstones or wall thickening
visualized. No sonographic Murphy sign noted by sonographer.

Common bile duct:

Diameter: 3 mm, normal.

Liver:

Heterogeneous and diffusely increased in parenchymal echogenicity.
In area of decreased echogenicity adjacent to the gallbladder fossa
is typical of focal fatty sparing. There is no discrete liver
lesion. No definite capsular nodularity. Portal vein is patent on
color Doppler imaging with normal direction of blood flow towards
the liver.

Other: No right upper quadrant ascites.
IMPRESSION: 1. Normal sonographic appearance of the gallbladder and biliary
tree.
2. Prominent hepatic steatosis with focal fatty sparing adjacent to
the gallbladder fossa.

## 2022-04-29 ENCOUNTER — Other Ambulatory Visit: Payer: Self-pay | Admitting: Family Medicine

## 2022-04-29 DIAGNOSIS — I10 Essential (primary) hypertension: Secondary | ICD-10-CM

## 2022-04-29 NOTE — Telephone Encounter (Signed)
Requested medication (s) are due for refill today: yes  Requested medication (s) are on the active medication list: yes  Last refill:  02/03/22  Future visit scheduled: no  Notes to clinic:  Unable to refill per protocol, appointment needed.      Requested Prescriptions  Pending Prescriptions Disp Refills   amLODipine (NORVASC) 10 MG tablet [Pharmacy Med Name: amLODIPine Besylate 10 MG Oral Tablet] 90 tablet 3    Sig: TAKE 1 TABLET BY MOUTH DAILY     Cardiovascular: Calcium Channel Blockers 2 Failed - 04/29/2022  5:03 AM      Failed - Last BP in normal range    BP Readings from Last 1 Encounters:  09/08/21 (!) 144/80         Failed - Valid encounter within last 6 months    Recent Outpatient Visits           7 months ago Type 2 diabetes mellitus with hyperglycemia, without long-term current use of insulin [facility])   [facility] S[place] Cords, DO   11 months ago Nasal congestion   [facility] [place], [place] W, NP   1 year ago Type 2 diabetes mellitus with hyperglycemia, without long-term current use of insulin [facility])   [facility], Netta Neat, DO   1 year ago Type 2 diabetes mellitus with hyperglycemia, without long-term current use of insulin [facility])   [facility] [place], Netta Neat, DO   2 years ago Type 2 diabetes mellitus with hyperglycemia, without long-term current use of insulin [facility])   [facility], Netta Neat, DO              Passed - Last Heart Rate in normal range    Pulse Readings from Last 1 Encounters:  09/08/21 92

## 2022-08-04 ENCOUNTER — Other Ambulatory Visit: Payer: Self-pay | Admitting: Family Medicine

## 2022-08-04 DIAGNOSIS — E1169 Type 2 diabetes mellitus with other specified complication: Secondary | ICD-10-CM

## 2022-08-04 NOTE — Telephone Encounter (Signed)
Requested medication (s) are due for refill today: yes   Requested medication (s) are on the active medication list: yes   Last refill:  09/01/21 #90 3 refills  Future visit scheduled: no   Notes to clinic:  protocol failed last labs 03/10/20. Needs appt . Do you want to refill Rx?     Requested Prescriptions  Pending Prescriptions Disp Refills   rosuvastatin (CRESTOR) 20 MG tablet [Pharmacy Med Name: Rosuvastatin Calcium 20 MG Oral Tablet] 90 tablet 3    Sig: TAKE 1 TABLET BY MOUTH AT  BEDTIME     Cardiovascular:  Antilipid - Statins 2 Failed - 08/04/2022  6:07 AM      Failed - Cr in normal range and within 360 days    Creat  Date Value Ref Range Status  03/16/2021 0.84 0.60 - 1.29 mg/dL Final         Failed - Lipid Panel in normal range within the last 12 months    Cholesterol, Total  Date Value Ref Range Status  09/16/2016 187 100 - 199 mg/dL Final   Cholesterol  Date Value Ref Range Status  03/10/2020 87 <200 mg/dL Final   LDL Cholesterol (Calc)  Date Value Ref Range Status  03/10/2020 36 mg/dL (calc) Final    Comment:    Reference range: <100 . Desirable range <100 mg/dL for primary prevention;   <70 mg/dL for patients with CHD or diabetic patients  with > or = 2 CHD risk factors. Marland Kitchen LDL-C is now calculated using the Martin-Hopkins  calculation, which is a validated novel method providing  better accuracy than the Friedewald equation in the  estimation of LDL-C.  Cresenciano Genre et al. Annamaria Helling. [id number]): 2061-2068  (http://education.[employer].com/faq/FAQ164)    HDL  Date Value Ref Range Status  03/10/2020 27 (L) > OR = 40 mg/dL Final  09/16/2016 31 (L) >39 mg/dL Final   Triglycerides  Date Value Ref Range Status  03/10/2020 153 (H) <150 mg/dL Final         Passed - Patient is not pregnant      Passed - Valid encounter within last 12 months    Recent Outpatient Visits           11 months ago Type 2 diabetes mellitus with hyperglycemia, without  long-term current use of insulin [facility])   [place], DO   1 year ago Nasal congestion   [facility] [place], [place] W, NP   1 year ago Type 2 diabetes mellitus with hyperglycemia, without long-term current use of insulin [facility])   [facility] [place], Devonne Doughty, DO   2 years ago Type 2 diabetes mellitus with hyperglycemia, without long-term current use of insulin [facility])   [place], DO   2 years ago Type 2 diabetes mellitus with hyperglycemia, without long-term current use of insulin [facility])   [place], [place]

## 2022-10-28 ENCOUNTER — Other Ambulatory Visit: Payer: Self-pay | Admitting: Family Medicine

## 2022-10-28 DIAGNOSIS — F3342 Major depressive disorder, recurrent, in full remission: Secondary | ICD-10-CM

## 2022-10-28 DIAGNOSIS — F5101 Primary insomnia: Secondary | ICD-10-CM

## 2022-10-28 NOTE — Telephone Encounter (Signed)
Called patient to schedule appt for medication refills. Annual exam needed. Scheduled for 11/03/22.

## 2022-10-28 NOTE — Telephone Encounter (Signed)
Requested by interface surescripts. Courtesy refill given. Future appt in 6 days.  Requested Prescriptions  Pending Prescriptions Disp Refills   traZODone (DESYREL) 100 MG tablet [Pharmacy Med Name: traZODone HCl 100 MG Oral Tablet] 90 tablet 0    Sig: TAKE 1 TABLET BY MOUTH AT  BEDTIME     Psychiatry: Antidepressants - Serotonin Modulator Failed - 10/28/2022  1:35 AM      Failed - Completed PHQ-2 or PHQ-9 in the last 360 days      Failed - Valid encounter within last 6 months    Recent Outpatient Visits           1 year ago Type 2 diabetes mellitus with hyperglycemia, without long-term current use of insulin [address] [facility])   [place] [facility] [place], Netta Neat, DO   1 year ago Nasal congestion   [place] [facility] [place], [place] W, NP   1 year ago Type 2 diabetes mellitus with hyperglycemia, without long-term current use of insulin [facility])   [place] [facility], Netta Neat, DO   2 years ago Type 2 diabetes mellitus with hyperglycemia, without long-term current use of insulin [facility])   [place] [facility] [place], Netta Neat, DO   2 years ago Type 2 diabetes mellitus with hyperglycemia, without long-term current use of insulin [facility])   [place] [facility] [place], Netta Neat, DO       Future Appointments             In 6 days Al[place]a Charon, Netta Neat, DO [place]

## 2022-11-03 ENCOUNTER — Ambulatory Visit (INDEPENDENT_AMBULATORY_CARE_PROVIDER_SITE_OTHER): Payer: 59 | Admitting: Family Medicine

## 2022-11-03 ENCOUNTER — Encounter: Payer: Self-pay | Admitting: Family Medicine

## 2022-11-03 VITALS — BP 132/80 | HR 91 | Ht 70.0 in | Wt 216.0 lb

## 2022-11-03 DIAGNOSIS — E1169 Type 2 diabetes mellitus with other specified complication: Secondary | ICD-10-CM

## 2022-11-03 DIAGNOSIS — E1165 Type 2 diabetes mellitus with hyperglycemia: Secondary | ICD-10-CM | POA: Diagnosis not present

## 2022-11-03 DIAGNOSIS — E785 Hyperlipidemia, unspecified: Secondary | ICD-10-CM

## 2022-11-03 DIAGNOSIS — F5101 Primary insomnia: Secondary | ICD-10-CM

## 2022-11-03 DIAGNOSIS — F331 Major depressive disorder, recurrent, moderate: Secondary | ICD-10-CM | POA: Diagnosis not present

## 2022-11-03 DIAGNOSIS — I10 Essential (primary) hypertension: Secondary | ICD-10-CM

## 2022-11-03 LAB — POCT GLYCOSYLATED HEMOGLOBIN (HGB A1C): Hemoglobin A1C: 12.4 % — AB (ref 4.0–5.6)

## 2022-11-03 MED ORDER — RYBELSUS 7 MG PO TABS: ORAL_TABLET | ORAL | 1 refills | Status: DC

## 2022-11-03 MED ORDER — RYBELSUS 14 MG PO TABS
14.0000 mg | ORAL_TABLET | Freq: Every day | ORAL | 1 refills | Status: DC
Start: 2022-11-03 — End: 2023-05-24

## 2022-11-03 MED ORDER — METFORMIN HCL 500 MG PO TABS: ORAL_TABLET | ORAL | 3 refills | Status: AC

## 2022-11-03 MED ORDER — ESCITALOPRAM OXALATE 10 MG PO TABS: 10.0000 mg | ORAL_TABLET | Freq: Every day | ORAL | 1 refills | Status: DC

## 2022-11-03 MED ORDER — ROSUVASTATIN CALCIUM 20 MG PO TABS: 20.0000 mg | ORAL_TABLET | Freq: Every day | ORAL | 1 refills | Status: DC

## 2022-11-03 MED ORDER — AMLODIPINE BESYLATE 10 MG PO TABS: 10.0000 mg | ORAL_TABLET | Freq: Every day | ORAL | 1 refills | Status: DC

## 2022-11-03 MED ORDER — TRAZODONE HCL 100 MG PO TABS: 100.0000 mg | ORAL_TABLET | Freq: Every day | ORAL | 1 refills | Status: DC

## 2022-11-03 NOTE — Patient Instructions (Addendum)
Thank you for coming to the office today.  Dose increase from Rybelsus 7mg  up to 14mg  when ready You can double the 7mg  x 2 = 14 when ready and we ordered the 14mg   All other meds ordered 90 day to [facility]  Urine test ordered today  Recent Labs    11/03/22 1410  HGBA1C 12.4*    Please schedule a Follow-up Appointment to: Return in about 6 months (around 05/05/2023) for 6 month - or early Jan is fine Follow-up DM A1c, mental health updates.  If you have any other questions or concerns, please feel free to call the office or send a message through MyChart. You may also schedule an earlier appointment if necessary.  Additionally, you may be receiving a survey about your experience at our office within a few days to 1 week by e-mail or mail. We value your feedback.  Saralyn Pilar, DO [facility], [place]

## 2022-11-03 NOTE — Progress Notes (Unsigned)
Subjective:    Patient ID: Curtis Parrish, male    DOB: 1980-09-30, 42 y.o.   MRN: [id number]  Curtis Parrish is a 42 y.o. male presenting on 11/03/2022 for Diabetes   HPI  TYPE 2 DIABETES / Hyperglycemia Hyperlipidemia / Obesity BMI >30 Due for A1c Off Trulicity He is still due for DM Eye exam has to schedule He admits improving lifestyle, goal to improve, reducing appetite. Meds: Rybelsus  7mg  daily, Metformin 1000mg  BID Not on ACEi/ARB Denies hypoglycemia, polyuria, visual changes, numbness or tingling.   Prior Endocrinology apt   Major Recurrent depression, in remission Insomnia Previously doing well on SSRI and Trazodone. He takes Trazodone PRN now not nightly. He feels like a "zombie" now on Sertraline - He failed Fluoxetine due to Migraine side effect in the past, also failed Wellbutrin  On Escitalopram 10mg  daily with improvement Improved on Trazodone 100mg  nightly, needs re order. Denies suicidal or homicidal ideation, or worsening anxiety      11/03/2022    1:50 PM 09/08/2021   11:13 AM 03/16/2021    9:32 AM  Depression screen PHQ 2/9  Decreased Interest 2 1 3   Down, Depressed, Hopeless 2 1 2   PHQ - 2 Score 4 2 5   Altered sleeping 2 0 0  Tired, decreased energy 2 2 2   Change in appetite 2 2 2   Feeling bad or failure about yourself  1 1 0  Trouble concentrating 1 0 0  Moving slowly or fidgety/restless 0 0 0  Suicidal thoughts 0 0 0  PHQ-9 Score 12 7 9   Difficult doing work/chores  Somewhat difficult Somewhat difficult      11/03/2022    1:50 PM 09/08/2021   11:13 AM 03/16/2021    9:33 AM 10/29/2019    1:49 PM  GAD 7 : Generalized Anxiety Score  Nervous, Anxious, on Edge 2 1 1 2   Control/stop worrying 1 1 1 1   Worry too much - different things 1 1 1 1   Trouble relaxing 2 1 1 2   Restless 1 1 1 1   Easily annoyed or irritable 1 1 1 1   Afraid - awful might happen 0 0 1 1  Total GAD 7 Score 8 6 7 9   Anxiety Difficulty  Somewhat difficult Somewhat  difficult Somewhat difficult      Past Medical History:  Diagnosis Date   Anxiety    Depression    Hypertension    History reviewed. No pertinent surgical history. Social History   Socioeconomic History   Marital status: Married    Spouse name: Not on file   Number of children: Not on file   Years of education: Not on file   Highest education level: Not on file  Occupational History   Not on file  Tobacco Use   Smoking status: Former    Packs/day: 0.50    Years: 30.00    Additional pack years: 0.00    Total pack years: 15.00    Types: Cigarettes   Smokeless tobacco: Former  Building services engineer Use: Every day  Substance and Sexual Activity   Alcohol use: No    Comment: Abstaining from alcohol for 15 years, prior history in age 54s   Drug use: No   Sexual activity: Not on file  Other Topics Concern   Not on file  Social History Narrative   Not on file   Social Determinants of Health   Financial Resource Strain: Not on file  Food Insecurity:  Not on file  Transportation Needs: Not on file  Physical Activity: Not on file  Stress: Not on file  Social Connections: Not on file  Intimate Partner Violence: Not on file   Family History  Problem Relation Age of Onset   Diabetes Mother    Prostate cancer Father    Gastric cancer Father    Hypertension Sister    Current Outpatient Medications on File Prior to Visit  Medication Sig   Blood Glucose Calibration (OT ULTRA/FASTTK CNTRL SOLN) SOLN Use to check blood sugar twice a day   Blood Glucose Monitoring Suppl (ONE TOUCH ULTRA 2) w/Device KIT Use to check blood sugar twice a day   fluticasone (FLONASE) 50 MCG/ACT nasal spray Place 2 sprays into both nostrils daily.   Lancets (ONETOUCH ULTRASOFT) lancets Use to check blood sugar twice a day   omeprazole (PRILOSEC) 20 MG capsule TAKE 1 CAPSULE BY MOUTH  DAILY   ondansetron (ZOFRAN ODT) 4 MG disintegrating tablet Take 1 tablet (4 mg total) by mouth every 8 (eight)  hours as needed for nausea or vomiting.   ONETOUCH ULTRA test strip Use to check blood sugar twice a day   SUMAtriptan (IMITREX) 25 MG tablet Take 1-2 tablets (25-50 mg total) by mouth once as needed for up to 1 dose for migraine. May repeat dose in 2 hr if HA persists = max 24 hr   No current facility-administered medications on file prior to visit.    Review of Systems Per HPI unless specifically indicated above      Objective:    BP 132/80   Pulse 91   Ht 5\' 10"  (1.778 m)   Wt 216 lb (98 kg)   SpO2 98%   BMI 30.99 kg/m   Wt Readings from Last 3 Encounters:  11/03/22 216 lb (98 kg)  09/08/21 223 lb 12.8 oz (101.5 kg)  03/16/21 240 lb 12.8 oz (109.2 kg)    Physical Exam Vitals and nursing note reviewed.  Constitutional:      General: He is not in acute distress.    Appearance: Normal appearance. He is well-developed. He is obese. He is not diaphoretic.     Comments: Well-appearing, comfortable, cooperative  HENT:     Head: Normocephalic and atraumatic.  Eyes:     General:        Right eye: No discharge.        Left eye: No discharge.     Conjunctiva/sclera: Conjunctivae normal.  Cardiovascular:     Rate and Rhythm: Normal rate.  Pulmonary:     Effort: Pulmonary effort is normal.  Skin:    General: Skin is warm and dry.     Findings: No erythema or rash.  Neurological:     Mental Status: He is alert and oriented to person, place, and time.  Psychiatric:        Mood and Affect: Mood normal.        Behavior: Behavior normal.        Thought Content: Thought content normal.     Comments: Well groomed, good eye contact, normal speech and thoughts     Diabetic Foot Exam - Simple   Simple Foot Form Diabetic Foot exam was performed with the following findings: Yes 11/03/2022  2:09 PM  Visual Inspection No deformities, no ulcerations, no other skin breakdown bilaterally: Yes Sensation Testing Intact to touch and monofilament testing bilaterally: Yes Pulse  Check Posterior Tibialis and Dorsalis pulse intact bilaterally: Yes Comments  Results for orders placed or performed in visit on 11/03/22  Urine microalbumin-creatinine with uACR  Result Value Ref Range   Creatinine, Urine 55 20 - 320 mg/dL   Microalb, Ur 0.8 mg/dL   Microalb Creat Ratio 15 <30 mg/g creat  POCT HgB A1C  Result Value Ref Range   Hemoglobin A1C 12.4 (A) 4.0 - 5.6 %   HbA1c POC (<> result, manual entry)     HbA1c, POC (prediabetic range)     HbA1c, POC (controlled diabetic range)        Assessment & Plan:   Problem List Items Addressed This Visit     Hyperlipidemia associated with type 2 diabetes mellitus (HCC)    Check labs lipid Continue Statin      Relevant Medications   metFORMIN (GLUCOPHAGE) 500 MG tablet   rosuvastatin (CRESTOR) 20 MG tablet   amLODipine (NORVASC) 10 MG tablet   RYBELSUS 14 MG TABS   Major depressive disorder, recurrent episode (HCC)    Chronic depression, recurrent chronic - now in remission on Sertraline Failed Fluoxetine due to side effect migraine Ineffective / off Wellbutrin Associated secondary insomnia  Plan Lexapro 10mg  daily, Trazodone 100mg  daily - If not improving still we can consider referral to psychiatry or behavioral health if indicated      Relevant Medications   traZODone (DESYREL) 100 MG tablet   escitalopram (LEXAPRO) 10 MG tablet   Primary insomnia    Persistent problem See A&P  Plan Continue Escitalopram 10mg  daily Continue Trazodone 100mg  daily  - If not improving still we can consider referral to psychiatry or behavioral health if indicated      Relevant Medications   traZODone (DESYREL) 100 MG tablet   Type 2 diabetes mellitus with hyperglycemia (HCC) - Primary    A1c up to 12.4 mostly dietary attributed Uncontrolled DM Complications - hyperlipidemia, depression, obesity, hyperglycemia - increases risk of future cardiovascular complications    Plan:  Inc Rybelsus 7 to 14 mg  daily Metformin IR 1000mg  TWICE A DAY (500mg  x 2) Encourage improved lifestyle - low carb, low sugar diet, reduce portion size, continue improving regular exercise Check CBG, bring log to next visit for review  DM Foot  On Rosuvastatin  Advised to schedule DM ophtho exam, send record      Relevant Medications   metFORMIN (GLUCOPHAGE) 500 MG tablet   rosuvastatin (CRESTOR) 20 MG tablet   RYBELSUS 14 MG TABS   Other Relevant Orders   Urine microalbumin-creatinine with uACR (Completed)   POCT HgB A1C (Completed)   Other Visit Diagnoses     Essential hypertension       Relevant Medications   rosuvastatin (CRESTOR) 20 MG tablet   amLODipine (NORVASC) 10 MG tablet       Recent Labs    11/03/22 1410  HGBA1C 12.4*    All other meds ordered 90 day to [facility]  Urine test ordered today  Meds ordered this encounter  Medications   traZODone (DESYREL) 100 MG tablet    Sig: Take 1 tablet (100 mg total) by mouth at bedtime.    Dispense:  90 tablet    Refill:  1   DISCONTD: RYBELSUS 7 MG TABS    Sig: TAKE 1 TABLET BY MOUTH DAILY  BEFORE BREAKFAST 1/2 HOUR BEFORE OTHER MEDICINES AND MEAL    Dispense:  90 tablet    Refill:  1   metFORMIN (GLUCOPHAGE) 500 MG tablet    Sig: TAKE 2 TABLETS BY MOUTH  TWICE DAILY  WITH A MEAL    Dispense:  360 tablet    Refill:  3   escitalopram (LEXAPRO) 10 MG tablet    Sig: Take 1 tablet (10 mg total) by mouth daily.    Dispense:  90 tablet    Refill:  1   rosuvastatin (CRESTOR) 20 MG tablet    Sig: Take 1 tablet (20 mg total) by mouth at bedtime.    Dispense:  90 tablet    Refill:  1   amLODipine (NORVASC) 10 MG tablet    Sig: Take 1 tablet (10 mg total) by mouth daily.    Dispense:  90 tablet    Refill:  1   RYBELSUS 14 MG TABS    Sig: Take 1 tablet (14 mg total) by mouth daily before breakfast.    Dispense:  90 tablet    Refill:  1    Dose increase from 7 to 14, not ready to fill yet.      Follow up plan: Return in about 6  months (around 05/05/2023) for 6 month - or early Jan is fine Follow-up DM A1c, mental health updates.  Saralyn Pilar, DO [facility] 11/03/2022, 2:02 PM

## 2022-11-04 ENCOUNTER — Encounter: Payer: Self-pay | Admitting: Family Medicine

## 2022-11-04 LAB — MICROALBUMIN / CREATININE URINE RATIO
Creatinine, Urine: 55 mg/dL (ref 20–320)
Microalb Creat Ratio: 15 mg/g creat (ref <30)
Microalb, Ur: 0.8 mg/dL

## 2022-11-04 NOTE — Assessment & Plan Note (Addendum)
A1c up to 12.4 mostly dietary attributed Uncontrolled DM Complications - hyperlipidemia, depression, obesity, hyperglycemia - increases risk of future cardiovascular complications    Plan:  Inc Rybelsus 7 to 14 mg daily Metformin IR 1000mg  TWICE A DAY (500mg  x 2) Encourage improved lifestyle - low carb, low sugar diet, reduce portion size, continue improving regular exercise Check CBG, bring log to next visit for review  DM Foot  On Rosuvastatin  Advised to schedule DM ophtho exam, send record

## 2022-11-04 NOTE — Assessment & Plan Note (Signed)
Chronic depression, recurrent chronic - now in remission on Sertraline Failed Fluoxetine due to side effect migraine Ineffective / off Wellbutrin Associated secondary insomnia  Plan Lexapro 10mg  daily, Trazodone 100mg  daily - If not improving still we can consider referral to psychiatry or behavioral health if indicated

## 2022-11-04 NOTE — Assessment & Plan Note (Signed)
Check labs lipid Continue Statin

## 2022-11-04 NOTE — Assessment & Plan Note (Addendum)
Persistent problem See A&P  Plan Continue Escitalopram 10mg  daily Continue Trazodone 100mg  daily  - If not improving still we can consider referral to psychiatry or behavioral health if indicated

## 2023-01-20 ENCOUNTER — Other Ambulatory Visit: Payer: Self-pay | Admitting: Oncology

## 2023-01-20 DIAGNOSIS — Z006 Encounter for examination for normal comparison and control in clinical research program: Secondary | ICD-10-CM

## 2023-03-09 ENCOUNTER — Other Ambulatory Visit
Admission: RE | Admit: 2023-03-09 | Discharge: 2023-03-09 | Disposition: A | Discharge status: Home / Self Care | Payer: 59 | Source: Ambulatory Visit | Attending: Oncology | Admitting: Oncology

## 2023-03-09 DIAGNOSIS — Z006 Encounter for examination for normal comparison and control in clinical research program: Secondary | ICD-10-CM | POA: Insufficient documentation

## 2023-03-10 ENCOUNTER — Other Ambulatory Visit: Payer: Self-pay | Admitting: Medical Genetics

## 2023-03-10 DIAGNOSIS — Z006 Encounter for examination for normal comparison and control in clinical research program: Secondary | ICD-10-CM

## 2023-03-10 LAB — HM DIABETES EYE EXAM

## 2023-03-10 NOTE — Progress Notes (Signed)
Additional [employer] order needed. Verified consent is on file.

## 2023-03-15 ENCOUNTER — Encounter: Payer: Self-pay | Admitting: Family Medicine

## 2023-03-21 LAB — HELIX MOLECULAR SCREEN: Genetic Analysis Overall Interpretation: NEGATIVE

## 2023-03-21 LAB — GENECONNECT MOLECULAR SCREEN

## 2023-03-23 ENCOUNTER — Other Ambulatory Visit: Payer: Self-pay | Admitting: Family Medicine

## 2023-03-23 DIAGNOSIS — E1169 Type 2 diabetes mellitus with other specified complication: Secondary | ICD-10-CM

## 2023-03-24 NOTE — Telephone Encounter (Signed)
Requested medications are due for refill today.  yes  Requested medications are on the active medications list.  yes  Last refill. 11/03/2022 #90 1 rf  Future visit scheduled.   yes  Notes to clinic.  Expired labs.    Requested Prescriptions  Pending Prescriptions Disp Refills   rosuvastatin (CRESTOR) 20 MG tablet [Pharmacy Med Name: Rosuvastatin Calcium 20 MG Oral Tablet] 90 tablet 3    Sig: TAKE 1 TABLET BY MOUTH AT  BEDTIME     Cardiovascular:  Antilipid - Statins 2 Failed - 03/23/2023 10:21 PM      Failed - Cr in normal range and within 360 days    Creat  Date Value Ref Range Status  03/16/2021 0.84 0.60 - 1.29 mg/dL Final   Creatinine, Urine  Date Value Ref Range Status  11/03/2022 55 20 - 320 mg/dL Final         Failed - Lipid Panel in normal range within the last 12 months    Cholesterol, Total  Date Value Ref Range Status  09/16/2016 187 100 - 199 mg/dL Final   Cholesterol  Date Value Ref Range Status  03/10/2020 87 <200 mg/dL Final   LDL Cholesterol (Calc)  Date Value Ref Range Status  03/10/2020 36 mg/dL (calc) Final    Comment:    Reference range: <100 . Desirable range <100 mg/dL for primary prevention;   <70 mg/dL for patients with CHD or diabetic patients  with > or = 2 CHD risk factors. Marland Kitchen LDL-C is now calculated using the Martin-Hopkins  calculation, which is a validated novel method providing  better accuracy than the Friedewald equation in the  estimation of LDL-C.  Horald Pollen et al. Lenox Ahr. 1610;960(45): 2061-2068  (http://education.[employer].com/faq/FAQ164)    HDL  Date Value Ref Range Status  03/10/2020 27 (L) > OR = 40 mg/dL Final  40/98/1191 31 (L) >39 mg/dL Final   Triglycerides  Date Value Ref Range Status  03/10/2020 153 (H) <150 mg/dL Final         Passed - Patient is not pregnant      Passed - Valid encounter within last 12 months    Recent Outpatient Visits           4 months ago Type 2 diabetes mellitus with  hyperglycemia, without long-term current use of insulin [facility])   [place] [facility] [place], Netta Neat, DO   1 year ago Type 2 diabetes mellitus with hyperglycemia, without long-term current use of insulin [facility])   [place] [facility] Smitty Cords, DO   1 year ago Nasal congestion   [place] [facility] [place], [place] W, NP   2 years ago Type 2 diabetes mellitus with hyperglycemia, without long-term current use of insulin [facility])   [place] [facility] [place], Netta Neat, DO   2 years ago Type 2 diabetes mellitus with hyperglycemia, without long-term current use of insulin [facility])   [place] [facility] [place], Netta Neat, DO       Future Appointments             In 2 months Althea Charon, Netta Neat, DO [place] [facility], [facility]

## 2023-03-29 ENCOUNTER — Other Ambulatory Visit: Payer: Self-pay | Admitting: Family Medicine

## 2023-03-29 DIAGNOSIS — F5101 Primary insomnia: Secondary | ICD-10-CM

## 2023-03-30 NOTE — Telephone Encounter (Signed)
Requested Prescriptions  Pending Prescriptions Disp Refills   traZODone (DESYREL) 100 MG tablet [Pharmacy Med Name: traZODone HCl 100 MG Oral Tablet] 90 tablet 0    Sig: TAKE 1 TABLET BY MOUTH AT  BEDTIME     Psychiatry: Antidepressants - Serotonin Modulator Passed - 03/29/2023 10:39 PM      Passed - Completed PHQ-2 or PHQ-9 in the last 360 days      Passed - Valid encounter within last 6 months    Recent Outpatient Visits           4 months ago Type 2 diabetes mellitus with hyperglycemia, without long-term current use of insulin [facility])   [place] [facility] [place], Netta Neat, DO   1 year ago Type 2 diabetes mellitus with hyperglycemia, without long-term current use of insulin [facility])   [place] [facility] Smitty Cords, DO   1 year ago Nasal congestion   [place] [facility] [place], [place] W, NP   2 years ago Type 2 diabetes mellitus with hyperglycemia, without long-term current use of insulin [facility])   [place] [facility] [place], Netta Neat, DO   2 years ago Type 2 diabetes mellitus with hyperglycemia, without long-term current use of insulin [facility])    [facility] [place], Netta Neat, DO       Future Appointments             In 1 month Althea Charon, Netta Neat, DO  [facility], [facility]

## 2023-05-24 ENCOUNTER — Ambulatory Visit: Payer: 59 | Admitting: Family Medicine

## 2023-05-24 ENCOUNTER — Other Ambulatory Visit: Payer: Self-pay | Admitting: Family Medicine

## 2023-05-24 VITALS — BP 140/90 | HR 112 | Ht 70.0 in | Wt 208.0 lb

## 2023-05-24 DIAGNOSIS — I10 Essential (primary) hypertension: Secondary | ICD-10-CM

## 2023-05-24 DIAGNOSIS — N529 Male erectile dysfunction, unspecified: Secondary | ICD-10-CM

## 2023-05-24 DIAGNOSIS — Z23 Encounter for immunization: Secondary | ICD-10-CM

## 2023-05-24 DIAGNOSIS — Z0000 Encounter for general adult medical examination without abnormal findings: Secondary | ICD-10-CM

## 2023-05-24 DIAGNOSIS — E1165 Type 2 diabetes mellitus with hyperglycemia: Secondary | ICD-10-CM

## 2023-05-24 DIAGNOSIS — F331 Major depressive disorder, recurrent, moderate: Secondary | ICD-10-CM

## 2023-05-24 DIAGNOSIS — F5101 Primary insomnia: Secondary | ICD-10-CM

## 2023-05-24 DIAGNOSIS — Z125 Encounter for screening for malignant neoplasm of prostate: Secondary | ICD-10-CM

## 2023-05-24 DIAGNOSIS — E1169 Type 2 diabetes mellitus with other specified complication: Secondary | ICD-10-CM

## 2023-05-24 LAB — POCT GLYCOSYLATED HEMOGLOBIN (HGB A1C): Hemoglobin A1C: 10.3 % — AB (ref 4.0–5.6)

## 2023-05-24 MED ORDER — AMLODIPINE BESYLATE 10 MG PO TABS: 10.0000 mg | ORAL_TABLET | Freq: Every day | ORAL | 1 refills | Status: DC

## 2023-05-24 MED ORDER — SILDENAFIL CITRATE 20 MG PO TABS: ORAL_TABLET | ORAL | 3 refills | Status: DC

## 2023-05-24 MED ORDER — RYBELSUS 14 MG PO TABS: 14.0000 mg | ORAL_TABLET | Freq: Every day | ORAL | 1 refills | Status: AC

## 2023-05-24 NOTE — Patient Instructions (Addendum)
 Thank you for coming to the office today.  Rybelsus  14mg  re ordered Continue Metformin , may reconsider PM dosing if interested Keep improving exercise diet  Start Sildenafil  20mg  - take 1 tablet about 30 min prior to sexual intercourse for improved erection. If this dose does not work or is not strong enough NEXT TIME you can increase to 2 pills for 40mg . Maximum dose is 5 pills or 100mg , most people end up taking 3-4 pills per dose and this decision is up to you based on the results.  Once you take a dose, you have to wait 24 hours to repeat a dose.  You will need to use www.goodrx.com website or app on phone to enter Sildenafil  20mg  medication and Get Free Coupon option to save for [facility] once you select pharmacy and # of pills. Show that coupon to pharmacy. Otherwise it will cost hundreds of dollars.  Follow up if not working or new concerns we can refer you to a Urologist.  -------------------  Flu Shot today  Colon Cancer Screening: - For all adults age 58+ routine colon cancer screening is highly recommended.     - Recent guidelines from American [employer] Society recommend starting age of 40 - Early detection of colon cancer is important, because often there are no warning signs or symptoms, also if found early usually it can be cured. Late stage is hard to treat.  - If you are not interested in Colonoscopy screening (if done and normal you could be cleared for 5 to 10 years until next due), then Cologuard is an excellent alternative for screening test for Colon Cancer. It is highly sensitive for detecting DNA of colon cancer from even the earliest stages. Also, there is NO bowel prep required. - If Cologuard is NEGATIVE, then it is good for 3 years before next due - If Cologuard is POSITIVE, then it is strongly advised to get a Colonoscopy, which allows the GI doctor to locate the source of the cancer or polyp (even very early stage) and treat it by removing  it. ------------------------- If you would like to proceed with Cologuard (stool DNA test) - FIRST, call your insurance company and tell them you want to check cost of Cologuard tell them CPT Code 18471 (it may be completely covered and you could get for no cost, OR max cost without any coverage is about $600). Also, keep in mind if you do NOT open the kit, and decide not to do the test, you will NOT be charged, you should contact the company if you decide not to do the test. - If you want to proceed, you can notify us  (phone message, MyChart Message, or at next visit) and we will order it for you. The test kit will be delivered to you house within about 1 week. Follow instructions to collect sample, you may call the company for any help or questions, 24/7 telephone support at [phone].  ------------------------------------------   You have been referred for a Coronary Calcium  Score Cardiac CT Scan. This is a screening test for patients aged 45-50+ with cardiovascular risk factors or who are healthy but would be interested in Cardiovascular Screening for heart disease. Even if there is a family history of heart disease, this imaging can be useful. Typically it can be done every 5+ years or at a different timeline we agree on  The scan will look at the chest and mainly focus on the heart and identify early signs of calcium  build up or  blockages within the heart arteries. It is not 100% accurate for identifying blockages or heart disease, but it is useful to help us  predict who may have some early changes or be at risk in the future for a heart attack or cardiovascular problem.  The results are reviewed by a Cardiologist and they will document the results. It should become available on MyChart. Typically the results are divided into percentiles based on other patients of the same demographic and age. So it will compare your risk to others similar to you. If you have a higher score >99 or  higher percentile >75%tile, it is recommended to consider Statin cholesterol therapy and or referral to Cardiologist. I will try to help explain your results and if we have questions we can contact the Cardiologist.  You will be contacted for scheduling. Usually it is done at any imaging facility through [facility], [facility] or [facility].  The cost is $99 flat fee total and it does not go through insurance, so no authorization is required.     DUE for FASTING BLOOD WORK (no food or drink after midnight before the lab appointment, only water or coffee without cream/sugar on the morning of)  SCHEDULE Lab Only visit in the morning at the clinic for lab draw in 3 MONTHS   - Make sure Lab Only appointment is at about 1 week before your next appointment, so that results will be available  For Lab Results, once available within 2-3 days of blood draw, you can can log in to MyChart online to view your results and a brief explanation. Also, we can discuss results at next follow-up visit.   Please schedule a Follow-up Appointment to: Return for 3 month fasting lab > 1 week later Annual Physical.  If you have any other questions or concerns, please feel free to call the office or send a message through MyChart. You may also schedule an earlier appointment if necessary.  Additionally, you may be receiving a survey about your experience at our office within a few days to 1 week by e-mail or mail. We value your feedback.  Marsa Officer, DO [facility] [place]

## 2023-05-24 NOTE — Progress Notes (Signed)
 PA started  [facility] (Key: [id number]) Rx #: [id number] Need Help? Call us  at [phone] Status New (Not sent to plan) Drug Sildenafil  Citrate (PAH) 20MG  tablets ePA cloud logo Form [facility] Electronic Prior Authorization Form [phone] NCPDP) Original Claim Info [phone]

## 2023-05-24 NOTE — Progress Notes (Signed)
 Subjective:    Patient ID: Curtis Parrish, male    DOB: 1980/05/29, 43 y.o.   MRN: [id number]  Curtis Parrish is a 43 y.o. male presenting on 05/24/2023 for Diabetes   HPI  Discussed the use of AI scribe software for clinical note transcription with the patient, who gave verbal consent to proceed.  History of Present Illness          History updated, Father passed with Fall 2024 with prostate cancer  TYPE 2 DIABETES / Hyperglycemia Hyperlipidemia / Obesity BMI >29 Prior A1c 12+ now today 10.3 Off Trulicity  He is still due for DM Eye exam has to schedule He admits improving lifestyle, goal to improve, reducing appetite. Meds: Rybelsus   14mg  daily, Metformin  1000mg  daily with meal (skipping PM dose)  Weight trend down 223 > 216 > 208 lbs last was in past 6 months, which he attributed to a loss of appetite and frequent nausea and vomiting after eating. The patient noted that these symptoms occur regardless of the portion size consumed.  the patient mentioned plans to start going to the gym as part of his efforts to improve his overall health and manage his diabetes. He acknowledged the need to reduce his intake of high-carbohydrate drinks, such as soda and energy drinks, as part of his dietary changes.   Denies hypoglycemia, polyuria, visual changes, numbness or tingling.  Reduced Appetite / Nausea Concern with possible Diabetic gastroparesis Prior referral to Endocrinology [facility] in 2022 but unable to get in by 2023 since they were not accepting new patients. He was unaware of this.   Major Recurrent depression, moderate Insomnia - He failed Fluoxetine  due to Migraine side effect in the past, also failed Wellbutrin , Sertraline   On Escitalopram  10mg  daily with improvement Improved on Trazodone  100mg  nightly Denies suicidal or homicidal ideation, or worsening anxiety  The patient also reported experiencing erectile dysfunction for over a year, which he attributed to various  stressors in his personal life. He expressed interest in starting treatment for this issue.    Health Maintenance: Flu Vaccine today     11/03/2022    1:50 PM 09/08/2021   11:13 AM 03/16/2021    9:32 AM  Depression screen PHQ 2/9  Decreased Interest 2 1 3   Down, Depressed, Hopeless 2 1 2   PHQ - 2 Score 4 2 5   Altered sleeping 2 0 0  Tired, decreased energy 2 2 2   Change in appetite 2 2 2   Feeling bad or failure about yourself  1 1 0  Trouble concentrating 1 0 0  Moving slowly or fidgety/restless 0 0 0  Suicidal thoughts 0 0 0  PHQ-9 Score 12 7 9   Difficult doing work/chores  Somewhat difficult Somewhat difficult       11/03/2022    1:50 PM 09/08/2021   11:13 AM 03/16/2021    9:33 AM 10/29/2019    1:49 PM  GAD 7 : Generalized Anxiety Score  Nervous, Anxious, on Edge 2 1 1 2   Control/stop worrying 1 1 1 1   Worry too much - different things 1 1 1 1   Trouble relaxing 2 1 1 2   Restless 1 1 1 1   Easily annoyed or irritable 1 1 1 1   Afraid - awful might happen 0 0 1 1  Total GAD 7 Score 8 6 7 9   Anxiety Difficulty  Somewhat difficult Somewhat difficult Somewhat difficult    Social History   Tobacco Use   Smoking status: Former  Current packs/day: 0.50    Average packs/day: 0.5 packs/day for 30.0 years (15.0 ttl pk-yrs)    Types: Cigarettes   Smokeless tobacco: Former  Building Services Engineer status: Every Day  Substance Use Topics   Alcohol use: No    Comment: Abstaining from alcohol for 15 years, prior history in age 12s   Drug use: No    Review of Systems Per HPI unless specifically indicated above     Objective:    BP (!) 140/90 (BP Location: Left Arm, Cuff Size: Normal)   Pulse (!) 112   Ht 5' 10 (1.778 m)   Wt 208 lb (94.3 kg)   SpO2 99%   BMI 29.84 kg/m   Wt Readings from Last 3 Encounters:  05/24/23 208 lb (94.3 kg)  11/03/22 216 lb (98 kg)  09/08/21 223 lb 12.8 oz (101.5 kg)    Physical Exam Vitals and nursing note reviewed.  Constitutional:       General: He is not in acute distress.    Appearance: He is well-developed. He is not diaphoretic.     Comments: Well-appearing, comfortable, cooperative  HENT:     Head: Normocephalic and atraumatic.  Eyes:     General:        Right eye: No discharge.        Left eye: No discharge.     Conjunctiva/sclera: Conjunctivae normal.  Neck:     Thyroid: No thyromegaly.  Cardiovascular:     Rate and Rhythm: Normal rate and regular rhythm.     Pulses: Normal pulses.     Heart sounds: Normal heart sounds. No murmur heard. Pulmonary:     Effort: Pulmonary effort is normal. No respiratory distress.     Breath sounds: Normal breath sounds. No wheezing or rales.  Musculoskeletal:        General: Normal range of motion.     Cervical back: Normal range of motion and neck supple.  Lymphadenopathy:     Cervical: No cervical adenopathy.  Skin:    General: Skin is warm and dry.     Findings: No erythema or rash.  Neurological:     Mental Status: He is alert and oriented to person, place, and time. Mental status is at baseline.  Psychiatric:        Behavior: Behavior normal.     Comments: Well groomed, good eye contact, normal speech and thoughts     Results for orders placed or performed in visit on 05/24/23  POCT HgB A1C   Collection Time: 05/24/23  1:12 PM  Result Value Ref Range   Hemoglobin A1C 10.3 (A) 4.0 - 5.6 %   HbA1c POC (<> result, manual entry)     HbA1c, POC (prediabetic range)     HbA1c, POC (controlled diabetic range)        Assessment & Plan:   Problem List Items Addressed This Visit     Erectile dysfunction   Relevant Medications   sildenafil  (REVATIO ) 20 MG tablet   Major depressive disorder, recurrent episode (HCC)   Primary insomnia   Type 2 diabetes mellitus with hyperglycemia (HCC) - Primary   Relevant Medications   RYBELSUS  14 MG TABS   Other Relevant Orders   POCT HgB A1C (Completed)   CT CARDIAC SCORING (SELF PAY ONLY)   Other Visit Diagnoses        Flu vaccine need       Relevant Orders   Flu vaccine trivalent PF, 6mos and older(Flulaval,Afluria,Fluarix,Fluzone) (Completed)  Essential hypertension       Relevant Medications   sildenafil  (REVATIO ) 20 MG tablet   amLODipine  (NORVASC ) 10 MG tablet   Other Relevant Orders   CT CARDIAC SCORING (SELF PAY ONLY)         Type 2 Diabetes Mellitus, uncontrolled but improving. A1c improved from 12.4 to 10.3, but still high. Patient reports difficulty eating due to nausea and vomiting, which may be due to gastroparesis or gallbladder disease. Weight loss noted. Note on GLP but still has worsening side effects question if med related or underlying etiology  -Continue Rybelsus  14mg  and Metformin . Consider increasing Metformin  to 1500 to 2000mg  daily. But concern side effects -Consider referral to gastroenterology for further evaluation of gastroparesis. -Check basic blood panel at next visit.  Erectile Dysfunction Patient reports no erections for over a year. Multiple potential contributing factors including mental health, physical health, cardiovascular disease, and hyperglycemia. -Start Sildenafil , beginning with a low dose and titrating as needed. Goodrx coupon  Hypertension Blood pressure slightly elevated at 150/90, potentially due to stress. Improved on manual repeat -Continue Amlodipine . Monitor blood pressure.  Cancer Screening Patient has a family history of prostate cancer and is interested in early screening due to genetic testing results. -Check prostate-specific antigen (PSA) at next visit when patient turns 43. -Discuss colon cancer screening options at next visit. / plan Cologuard  Stress/Anxiety Patient reports high levels of stress and anxiety. -Continue Lexapro . Consider referral to mental health services if stress/anxiety continues to be unmanageable.  General Health Maintenance -Flu shot administered today. Ordered Coronary Calcium  score CT scan for  heart disease risk stratification      Orders Placed This Encounter  Procedures   CT CARDIAC SCORING (SELF PAY ONLY)    Standing Status:   Future    Expiration Date:   05/23/2024    Preferred imaging location?:   [facility]   Flu vaccine trivalent PF, 6mos and older(Flulaval,Afluria,Fluarix,Fluzone)   POCT HgB A1C    Meds ordered this encounter  Medications   sildenafil  (REVATIO ) 20 MG tablet    Sig: Take 1-5 pills about 30 min prior to sex. Start with 1 and increase as needed.    Dispense:  90 tablet    Refill:  3   RYBELSUS  14 MG TABS    Sig: Take 1 tablet (14 mg total) by mouth daily before breakfast.    Dispense:  90 tablet    Refill:  1   amLODipine  (NORVASC ) 10 MG tablet    Sig: Take 1 tablet (10 mg total) by mouth daily.    Dispense:  90 tablet    Refill:  1    Follow up plan: Return for 3 month fasting lab > 1 week later Annual Physical.  Future labs ordered for 08/2023   Marsa Officer, DO [facility] 05/24/2023, 1:18 PM

## 2023-05-25 NOTE — Progress Notes (Signed)
 PA started  [facility] (Key: [id number]) Rx #: [id number] Need Help? Call us  at [phone] Archived today by [id number] [facility] Status Archived (unknown outcome) Drug Sildenafil  Citrate (PAH) 20MG  tablets ePA cloud logo Form [facility] Electronic Prior Authorization Form [phone] NCPDP) Original Claim Info [id number]

## 2023-05-27 ENCOUNTER — Ambulatory Visit
Admission: RE | Admit: 2023-05-27 | Discharge: 2023-05-27 | Disposition: A | Discharge status: Home / Self Care | Payer: Self-pay | Source: Ambulatory Visit | Attending: Family Medicine | Admitting: Family Medicine

## 2023-05-27 DIAGNOSIS — E1165 Type 2 diabetes mellitus with hyperglycemia: Secondary | ICD-10-CM | POA: Insufficient documentation

## 2023-05-27 DIAGNOSIS — I10 Essential (primary) hypertension: Secondary | ICD-10-CM | POA: Insufficient documentation

## 2023-05-30 ENCOUNTER — Encounter: Payer: Self-pay | Admitting: Family Medicine

## 2023-05-30 DIAGNOSIS — R918 Other nonspecific abnormal finding of lung field: Secondary | ICD-10-CM | POA: Insufficient documentation

## 2023-06-05 ENCOUNTER — Other Ambulatory Visit: Payer: Self-pay | Admitting: Family Medicine

## 2023-06-05 DIAGNOSIS — F5101 Primary insomnia: Secondary | ICD-10-CM

## 2023-06-06 NOTE — Telephone Encounter (Signed)
Requested Prescriptions  Pending Prescriptions Disp Refills   traZODone (DESYREL) 100 MG tablet [Pharmacy Med Name: traZODone HCl 100 MG Oral Tablet] 90 tablet 1    Sig: TAKE 1 TABLET BY MOUTH AT  BEDTIME     Psychiatry: Antidepressants - Serotonin Modulator Passed - 06/06/2023  2:57 PM      Passed - Completed PHQ-2 or PHQ-9 in the last 360 days      Passed - Valid encounter within last 6 months    Recent Outpatient Visits           1 week ago Type 2 diabetes mellitus with hyperglycemia, without long-term current use of insulin [facility])   [place] [facility], Netta Neat, DO   7 months ago Type 2 diabetes mellitus with hyperglycemia, without long-term current use of insulin [facility])   [facility] [place], Netta Neat, DO   1 year ago Type 2 diabetes mellitus with hyperglycemia, without long-term current use of insulin [facility])   [place] [facility] Smitty Cords, DO   2 years ago Nasal congestion   [place] [facility] [place], [place] W, NP   2 years ago Type 2 diabetes mellitus with hyperglycemia, without long-term current use of insulin [facility])   [place] [facility] Althea Charon, Netta Neat, DO       Future Appointments             In 2 months Althea Charon, Netta Neat, DO [place] [facility]

## 2023-08-24 ENCOUNTER — Other Ambulatory Visit: Payer: Self-pay

## 2023-08-24 DIAGNOSIS — Z125 Encounter for screening for malignant neoplasm of prostate: Secondary | ICD-10-CM

## 2023-08-24 DIAGNOSIS — E1169 Type 2 diabetes mellitus with other specified complication: Secondary | ICD-10-CM

## 2023-08-24 DIAGNOSIS — I10 Essential (primary) hypertension: Secondary | ICD-10-CM

## 2023-08-24 DIAGNOSIS — E1165 Type 2 diabetes mellitus with hyperglycemia: Secondary | ICD-10-CM

## 2023-08-24 DIAGNOSIS — E785 Hyperlipidemia, unspecified: Secondary | ICD-10-CM

## 2023-08-24 DIAGNOSIS — Z0000 Encounter for general adult medical examination without abnormal findings: Secondary | ICD-10-CM

## 2023-08-24 DIAGNOSIS — F331 Major depressive disorder, recurrent, moderate: Secondary | ICD-10-CM

## 2023-08-25 LAB — MICROALBUMIN / CREATININE URINE RATIO
Creatinine, Urine: 64 mg/dL (ref 20–320)
Microalb Creat Ratio: 3 mg/g{creat} (ref <30)
Microalb, Ur: 0.2 mg/dL

## 2023-08-25 LAB — CBC WITH DIFFERENTIAL/PLATELET
Absolute Lymphocytes: 2777 {cells}/uL (ref 850–3900)
Absolute Monocytes: 624 {cells}/uL (ref 200–950)
Basophils Absolute: 109 {cells}/uL (ref 0–200)
Basophils Relative: 1.4 %
Eosinophils Absolute: 273 {cells}/uL (ref 15–500)
Eosinophils Relative: 3.5 %
HCT: 47.6 % (ref 38.5–50.0)
Hemoglobin: 15.7 g/dL (ref 13.2–17.1)
MCH: 28.1 pg (ref 27.0–33.0)
MCHC: 33 g/dL (ref 32.0–36.0)
MCV: 85.3 fL (ref 80.0–100.0)
MPV: 12.8 fL — ABNORMAL HIGH (ref 7.5–12.5)
Monocytes Relative: 8 %
Neutro Abs: 4017 {cells}/uL (ref 1500–7800)
Neutrophils Relative %: 51.5 %
Platelets: 177 10*3/uL (ref 140–400)
RBC: 5.58 10*6/uL (ref 4.20–5.80)
RDW: 11.8 % (ref 11.0–15.0)
Total Lymphocyte: 35.6 %
WBC: 7.8 10*3/uL (ref 3.8–10.8)

## 2023-08-25 LAB — COMPLETE METABOLIC PANEL WITHOUT GFR
AG Ratio: 1.6 (calc) (ref 1.0–2.5)
ALT: 71 U/L — ABNORMAL HIGH (ref 9–46)
AST: 44 U/L — ABNORMAL HIGH (ref 10–40)
Albumin: 4.5 g/dL (ref 3.6–5.1)
Alkaline phosphatase (APISO): 78 U/L (ref 36–130)
BUN/Creatinine Ratio: 8 (calc) (ref 6–22)
BUN: 6 mg/dL — ABNORMAL LOW (ref 7–25)
CO2: 29 mmol/L (ref 20–32)
Calcium: 9.4 mg/dL (ref 8.6–10.3)
Chloride: 99 mmol/L (ref 98–110)
Creat: 0.75 mg/dL (ref 0.60–1.29)
Globulin: 2.8 g/dL (ref 1.9–3.7)
Glucose, Bld: 349 mg/dL — ABNORMAL HIGH (ref 65–99)
Potassium: 4.1 mmol/L (ref 3.5–5.3)
Sodium: 136 mmol/L (ref 135–146)
Total Bilirubin: 0.9 mg/dL (ref 0.2–1.2)
Total Protein: 7.3 g/dL (ref 6.1–8.1)

## 2023-08-25 LAB — LIPID PANEL
Cholesterol: 199 mg/dL (ref <200)
HDL: 32 mg/dL — ABNORMAL LOW (ref >=40)
LDL Cholesterol (Calc): 135 mg/dL — ABNORMAL HIGH
Non-HDL Cholesterol (Calc): 167 mg/dL — ABNORMAL HIGH (ref <130)
Total CHOL/HDL Ratio: 6.2 (calc) — ABNORMAL HIGH (ref <5.0)
Triglycerides: 187 mg/dL — ABNORMAL HIGH (ref <150)

## 2023-08-25 LAB — HEMOGLOBIN A1C
Hgb A1c MFr Bld: 11.8 %{Hb} — ABNORMAL HIGH (ref <5.7)
Mean Plasma Glucose: 292 mg/dL
eAG (mmol/L): 16.2 mmol/L

## 2023-08-25 LAB — TSH: TSH: 2.51 m[IU]/L (ref 0.40–4.50)

## 2023-08-25 LAB — PSA: PSA: 0.62 ng/mL (ref <=4.00)

## 2023-09-02 ENCOUNTER — Ambulatory Visit: Payer: 59 | Admitting: Family Medicine

## 2023-09-02 ENCOUNTER — Encounter: Payer: Self-pay | Admitting: Family Medicine

## 2023-09-02 VITALS — BP 124/80 | HR 83 | Ht 70.0 in | Wt 212.1 lb

## 2023-09-02 DIAGNOSIS — R918 Other nonspecific abnormal finding of lung field: Secondary | ICD-10-CM

## 2023-09-02 DIAGNOSIS — I10 Essential (primary) hypertension: Secondary | ICD-10-CM | POA: Diagnosis not present

## 2023-09-02 DIAGNOSIS — Z7984 Long term (current) use of oral hypoglycemic drugs: Secondary | ICD-10-CM

## 2023-09-02 DIAGNOSIS — N529 Male erectile dysfunction, unspecified: Secondary | ICD-10-CM

## 2023-09-02 DIAGNOSIS — E1165 Type 2 diabetes mellitus with hyperglycemia: Secondary | ICD-10-CM | POA: Diagnosis not present

## 2023-09-02 DIAGNOSIS — Z1211 Encounter for screening for malignant neoplasm of colon: Secondary | ICD-10-CM

## 2023-09-02 DIAGNOSIS — Z0000 Encounter for general adult medical examination without abnormal findings: Secondary | ICD-10-CM

## 2023-09-02 DIAGNOSIS — Z87891 Personal history of nicotine dependence: Secondary | ICD-10-CM

## 2023-09-02 DIAGNOSIS — E1169 Type 2 diabetes mellitus with other specified complication: Secondary | ICD-10-CM

## 2023-09-02 DIAGNOSIS — F331 Major depressive disorder, recurrent, moderate: Secondary | ICD-10-CM

## 2023-09-02 DIAGNOSIS — E785 Hyperlipidemia, unspecified: Secondary | ICD-10-CM

## 2023-09-02 DIAGNOSIS — R1013 Epigastric pain: Secondary | ICD-10-CM

## 2023-09-02 MED ORDER — OMEPRAZOLE 40 MG PO CPDR: 40.0000 mg | DELAYED_RELEASE_CAPSULE | Freq: Every day | ORAL | 3 refills | Status: AC

## 2023-09-02 MED ORDER — ESCITALOPRAM OXALATE 20 MG PO TABS: 20.0000 mg | ORAL_TABLET | Freq: Every day | ORAL | 3 refills | Status: AC

## 2023-09-02 MED ORDER — TADALAFIL 20 MG PO TABS: 20.0000 mg | ORAL_TABLET | ORAL | 0 refills | Status: AC | PRN

## 2023-09-02 NOTE — Progress Notes (Signed)
 Subjective:    Patient ID: Curtis Parrish, male    DOB: 22-May-1980, 43 y.o.   MRN: [id number]  Curtis BIRCHER is a 43 y.o. male presenting on 09/02/2023 for Annual Exam and Diabetes   HPI  Discussed the use of AI scribe software for clinical note transcription with the patient, who gave verbal consent to proceed.  History of Present Illness   He is a 43 year old male who presents for an annual physical exam.  ED He reports erectile dysfunction and has tried sildenafil  without success, even at high doses. He has a history of this issue for over 30 years.  GERD He experiences a constant dull pain under the ribcage, which he describes as possibly digestive in nature. He takes omeprazole  20 mg, which he obtained over the counter, and is considering increasing the dose.      HYPERLIPIDEMIA: He has elevated LDL cholesterol levels, which have increased to the 130s. He has been off schedule with his rosuvastatin  20 mg due to family issues but plans to resume it. - Currently taking Rosuvastatin  20mg  (OFF NOW), tolerating well without side effects or myalgias  TYPE 2 DIABETES / Hyperglycemia Hyperlipidemia / Obesity BMI >30 His A1c has increased to 11.8, attributed to stress eating and family issues. He is currently taking Rybelsus  and metformin . He takes Rybelsus  first thing in the morning with a 30-minute window and does not experience appetite suppression. Off Trulicity  Meds: Rybelsus   14mg  daily, Metformin  1000mg  daily with meal (skipping PM dose)    Denies hypoglycemia, polyuria, visual changes, numbness or tingling.   Major Recurrent depression, moderate Insomnia - He failed Fluoxetine  due to Migraine side effect in the past, also failed Wellbutrin , Sertraline   On Escitalopram  10mg  daily with improvement Improved on Trazodone  100mg  nightly He is experiencing stress and depression due to family issues, leading to poor dietary habits Interested in dose inc Escitalopram  Denies suicidal  or homicidal ideation, or worsening anxiety   The patient also reported experiencing erectile dysfunction for over a year, which he attributed to various stressors in his personal life. He expressed interest in starting treatment for this issue.   Pulmonary Nodules X 2 tiny nodules He has a history of smoking since the age of 75, smoking half a pack per day, and quit smoking one year ago. A CT scan in January revealed two tiny pulmonary nodules. Due for repeat in January 2026   Health Maintenance:  History updated, Father passed with Fall 2024 with prostate cancer  Due for Colon CA Screening age 51+ now, ordered Cologuard.     09/02/2023    8:09 AM 11/03/2022    1:50 PM 09/08/2021   11:13 AM  Depression screen PHQ 2/9  Decreased Interest 3 2 1   Down, Depressed, Hopeless 2 2 1   PHQ - 2 Score 5 4 2   Altered sleeping 2 2 0  Tired, decreased energy 2 2 2   Change in appetite 2 2 2   Feeling bad or failure about yourself  1 1 1   Trouble concentrating 2 1 0  Moving slowly or fidgety/restless 1 0 0  Suicidal thoughts 0 0 0  PHQ-9 Score 15 12 7   Difficult doing work/chores Somewhat difficult  Somewhat difficult       09/02/2023    8:09 AM 11/03/2022    1:50 PM 09/08/2021   11:13 AM 03/16/2021    9:33 AM  GAD 7 : Generalized Anxiety Score  Nervous, Anxious, on Edge 3 2 1  1  Control/stop worrying 2 1 1 1   Worry too much - different things 2 1 1 1   Trouble relaxing 3 2 1 1   Restless 2 1 1 1   Easily annoyed or irritable 1 1 1 1   Afraid - awful might happen 1 0 0 1  Total GAD 7 Score 14 8 6 7   Anxiety Difficulty Somewhat difficult  Somewhat difficult Somewhat difficult     Past Medical History:  Diagnosis Date  . Anxiety   . Depression   . Hypertension    History reviewed. No pertinent surgical history. Social History   Socioeconomic History  . Marital status: Married    Spouse name: Not on file  . Number of children: Not on file  . Years of education: Not on file   . Highest education level: Associate degree: occupational, Scientist, product/process development, or vocational program  Occupational History  . Not on file  Tobacco Use  . Smoking status: Former    Current packs/day: 0.50    Average packs/day: 0.5 packs/day for 30.0 years (15.0 ttl pk-yrs)    Types: Cigarettes  . Smokeless tobacco: Former  Advertising account planner  . Vaping status: Every Day  Substance and Sexual Activity  . Alcohol use: No    Comment: Abstaining from alcohol for 15 years, prior history in age 60s  . Drug use: No  . Sexual activity: Not on file  Other Topics Concern  . Not on file  Social History Narrative  . Not on file   Social Drivers of Health   Financial Resource Strain: Low Risk  (05/24/2023)   Overall Financial Resource Strain (CARDIA)   . Difficulty of Paying Living Expenses: Not very hard  Food Insecurity: No Food Insecurity (05/24/2023)   Hunger Vital Sign   . Worried About Programme researcher, broadcasting/film/video in the Last Year: Never true   . Ran Out of Food in the Last Year: Never true  Transportation Needs: No Transportation Needs (05/24/2023)   PRAPARE - Transportation   . Lack of Transportation (Medical): No   . Lack of Transportation (Non-Medical): No  Physical Activity: Insufficiently Active (05/24/2023)   Exercise Vital Sign   . Days of Exercise per Week: 1 day   . Minutes of Exercise per Session: 30 min  Stress: Stress Concern Present (05/24/2023)   [employer] of Occupational Health - Occupational Stress Questionnaire   . Feeling of Stress : To some extent  Social Connections: Socially Isolated (05/24/2023)   Social Connection and Isolation Panel [NHANES]   . Frequency of Communication with Friends and Family: Once a week   . Frequency of Social Gatherings with Friends and Family: Once a week   . Attends Religious Services: Never   . Active Member of Clubs or Organizations: No   . Attends Banker Meetings: Not on file   . Marital Status: Married  Catering manager Violence: Not on  file   Family History  Problem Relation Age of Onset  . Diabetes Mother   . Prostate cancer Father   . Gastric cancer Father   . Hypertension Sister    Current Outpatient Medications on File Prior to Visit  Medication Sig  . amLODipine  (NORVASC ) 10 MG tablet Take 1 tablet (10 mg total) by mouth daily.  . Blood Glucose Calibration (OT ULTRA/FASTTK CNTRL SOLN) SOLN Use to check blood sugar twice a day  . Blood Glucose Monitoring Suppl (ONE TOUCH ULTRA 2) w/Device KIT Use to check blood sugar twice a day  . fluticasone  (FLONASE )  50 MCG/ACT nasal spray Place 2 sprays into both nostrils daily.  . Lancets (ONETOUCH ULTRASOFT) lancets Use to check blood sugar twice a day  . metFORMIN  (GLUCOPHAGE ) 500 MG tablet TAKE 2 TABLETS BY MOUTH  TWICE DAILY WITH A MEAL  . ondansetron  (ZOFRAN  ODT) 4 MG disintegrating tablet Take 1 tablet (4 mg total) by mouth every 8 (eight) hours as needed for nausea or vomiting.  Emmett Harman ULTRA test strip Use to check blood sugar twice a day  . rosuvastatin  (CRESTOR ) 20 MG tablet TAKE 1 TABLET BY MOUTH AT  BEDTIME  . RYBELSUS  14 MG TABS Take 1 tablet (14 mg total) by mouth daily before breakfast.  . SUMAtriptan  (IMITREX ) 25 MG tablet Take 1-2 tablets (25-50 mg total) by mouth once as needed for up to 1 dose for migraine. May repeat dose in 2 hr if HA persists = max 24 hr  . traZODone  (DESYREL ) 100 MG tablet TAKE 1 TABLET BY MOUTH AT  BEDTIME   No current facility-administered medications on file prior to visit.    Review of Systems  Constitutional:  Negative for activity change, appetite change, chills, diaphoresis, fatigue and fever.  HENT:  Negative for congestion and hearing loss.   Eyes:  Negative for visual disturbance.  Respiratory:  Negative for cough, chest tightness, shortness of breath and wheezing.   Cardiovascular:  Negative for chest pain, palpitations and leg swelling.  Gastrointestinal:  Negative for abdominal pain, constipation, diarrhea, nausea and  vomiting.  Genitourinary:  Negative for dysuria, frequency and hematuria.  Musculoskeletal:  Negative for arthralgias and neck pain.  Skin:  Negative for rash.  Neurological:  Negative for dizziness, weakness, light-headedness, numbness and headaches.  Hematological:  Negative for adenopathy.  Psychiatric/Behavioral:  Negative for behavioral problems, dysphoric mood and sleep disturbance.    Per HPI unless specifically indicated above     Objective:    BP 124/80 (BP Location: Left Arm, Patient Position: Sitting, Cuff Size: Normal)   Pulse 83   Ht 5\' 10"  (1.778 m)   Wt 212 lb 2 oz (96.2 kg)   SpO2 99%   BMI 30.44 kg/m   Wt Readings from Last 3 Encounters:  09/02/23 212 lb 2 oz (96.2 kg)  05/24/23 208 lb (94.3 kg)  11/03/22 216 lb (98 kg)    Physical Exam Vitals and nursing note reviewed.  Constitutional:      General: He is not in acute distress.    Appearance: He is well-developed. He is obese. He is not diaphoretic.     Comments: Well-appearing, comfortable, cooperative  HENT:     Head: Normocephalic and atraumatic.  Eyes:     General:        Right eye: No discharge.        Left eye: No discharge.     Conjunctiva/sclera: Conjunctivae normal.     Pupils: Pupils are equal, round, and reactive to light.  Neck:     Thyroid: No thyromegaly.     Vascular: No carotid bruit.  Cardiovascular:     Rate and Rhythm: Normal rate and regular rhythm.     Pulses: Normal pulses.     Heart sounds: Normal heart sounds. No murmur heard. Pulmonary:     Effort: Pulmonary effort is normal. No respiratory distress.     Breath sounds: Normal breath sounds. No wheezing or rales.  Abdominal:     General: Bowel sounds are normal. There is no distension.     Palpations: Abdomen is soft. There is  no mass.     Tenderness: There is no abdominal tenderness.  Musculoskeletal:        General: No tenderness. Normal range of motion.     Cervical back: Normal range of motion and neck supple.      Right lower leg: No edema.     Left lower leg: No edema.     Comments: Upper / Lower Extremities: - Normal muscle tone, strength bilateral upper extremities 5/5, lower extremities 5/5  Lymphadenopathy:     Cervical: No cervical adenopathy.  Skin:    General: Skin is warm and dry.     Findings: No erythema or rash.  Neurological:     Mental Status: He is alert and oriented to person, place, and time.     Comments: Distal sensation intact to light touch all extremities  Psychiatric:        Mood and Affect: Mood normal.        Behavior: Behavior normal.        Thought Content: Thought content normal.     Comments: Well groomed, good eye contact, normal speech and thoughts    I have personally reviewed the radiology report from 05/27/23 on CT Calcium  Score Chest.  ADDENDUM REPORT: 05/28/2023 14:52   EXAM: OVER-READ INTERPRETATION  CT CHEST   The following report is an over-read performed by radiologist Dr. Chadwick Colonel of [facility] on 05/28/2023. This over-read does not include interpretation of cardiac or coronary anatomy or pathology. The coronary calcium  score interpretation by the cardiologist is attached.   COMPARISON:  None.   FINDINGS: Vascular: No aortic atherosclerosis. The included aorta is normal in caliber.   Mediastinum/nodes: No adenopathy or mass. Unremarkable esophagus.   Lungs: No focal airspace disease. A 3 mm subpleural right lower lobe nodule series 2, image 22. 2 mm subpleural left lower lobe nodule on same image series 2, image 22. No pleural fluid. The included airways are patent.   Upper abdomen: No acute or unexpected findings.   Musculoskeletal: There are no acute or suspicious osseous abnormalities.   IMPRESSION: 1. No acute or significant extracardiac findings. 2. Two tiny subpleural lower lobe pulmonary nodules. No follow-up needed if patient is low-risk (and has no known or suspected primary neoplasm). Non-contrast chest  CT can be considered in 12 months if patient is high-risk. This recommendation follows the consensus statement: Guidelines for Management of Incidental Pulmonary Nodules Detected on CT Images: From the Fleischner Society 2017; Radiology 2017; 284:228-243.     Electronically Signed   By: Chadwick Colonel M.D.   On: 05/28/2023 14:52    Addended by Katrina Parma, MD on 05/28/2023  2:54 PM    Study Result  Narrative & Impression  CLINICAL DATA:  Risk stratification   EXAM: Coronary Calcium  Score   TECHNIQUE: The patient was scanned on a Siemens Somatom scanner. Axial non-contrast 3 mm slices were carried out through the heart. The data set was analyzed on a dedicated work station and scored using the Agatson method.   FINDINGS: Non-cardiac: See separate report from [facility].   Ascending Aorta: Normal size   Pericardium: Normal   Coronary arteries: Normal origin of left and right coronary arteries. Distribution of arterial calcifications if present, as noted below;   LM 0   LAD 3.36   LCx 0   RCA 33.1   Total 36.5   IMPRESSION AND RECOMMENDATION: 1. Coronary calcium  score of 36.5.   2. CAC 1-99 in LAD, RCA. CAC-DRS A1/N2.  3. Continue heart healthy lifestyle and risk factor modification.   Electronically Signed: By: Constancia Delton M.D. On: 05/27/2023 15:07     Results for orders placed or performed in visit on 08/24/23  TSH   Collection Time: 08/24/23  8:30 AM  Result Value Ref Range   TSH 2.51 0.40 - 4.50 mIU/L  Microalbumin / creatinine urine ratio   Collection Time: 08/24/23  8:30 AM  Result Value Ref Range   Creatinine, Urine 64 20 - 320 mg/dL   Microalb, Ur 0.2 mg/dL   Microalb Creat Ratio 3 <30 mg/g creat  PSA   Collection Time: 08/24/23  8:30 AM  Result Value Ref Range   PSA 0.62 < OR = 4.00 ng/mL  COMPLETE METABOLIC PANEL WITH GFR   Collection Time: 08/24/23  8:30 AM  Result Value Ref Range   Glucose, Bld 349 (H)  65 - 99 mg/dL   BUN 6 (L) 7 - 25 mg/dL   Creat 2.95 6.21 - 3.08 mg/dL   BUN/Creatinine Ratio 8 6 - 22 (calc)   Sodium 136 135 - 146 mmol/L   Potassium 4.1 3.5 - 5.3 mmol/L   Chloride 99 98 - 110 mmol/L   CO2 29 20 - 32 mmol/L   Calcium  9.4 8.6 - 10.3 mg/dL   Total Protein 7.3 6.1 - 8.1 g/dL   Albumin 4.5 3.6 - 5.1 g/dL   Globulin 2.8 1.9 - 3.7 g/dL (calc)   AG Ratio 1.6 1.0 - 2.5 (calc)   Total Bilirubin 0.9 0.2 - 1.2 mg/dL   Alkaline phosphatase (APISO) 78 36 - 130 U/L   AST 44 (H) 10 - 40 U/L   ALT 71 (H) 9 - 46 U/L  CBC with Differential/Platelet   Collection Time: 08/24/23  8:30 AM  Result Value Ref Range   WBC 7.8 3.8 - 10.8 Thousand/uL   RBC 5.58 4.20 - 5.80 Million/uL   Hemoglobin 15.7 13.2 - 17.1 g/dL   HCT 65.7 84.6 - 96.2 %   MCV 85.3 80.0 - 100.0 fL   MCH 28.1 27.0 - 33.0 pg   MCHC 33.0 32.0 - 36.0 g/dL   RDW 95.2 84.1 - 32.4 %   Platelets 177 140 - 400 Thousand/uL   MPV 12.8 (H) 7.5 - 12.5 fL   Neutro Abs 4,017 1,500 - 7,800 cells/uL   Absolute Lymphocytes 2,777 850 - 3,900 cells/uL   Absolute Monocytes 624 200 - 950 cells/uL   Eosinophils Absolute 273 15 - 500 cells/uL   Basophils Absolute 109 0 - 200 cells/uL   Neutrophils Relative % 51.5 %   Total Lymphocyte 35.6 %   Monocytes Relative 8.0 %   Eosinophils Relative 3.5 %   Basophils Relative 1.4 %  Hemoglobin A1c   Collection Time: 08/24/23  8:30 AM  Result Value Ref Range   Hgb A1c MFr Bld 11.8 (H) <5.7 % of total Hgb   Mean Plasma Glucose 292 mg/dL   eAG (mmol/L) 40.1 mmol/L  Lipid panel   Collection Time: 08/24/23  8:30 AM  Result Value Ref Range   Cholesterol 199 <200 mg/dL   HDL 32 (L) > OR = 40 mg/dL   Triglycerides 027 (H) <150 mg/dL   LDL Cholesterol (Calc) 135 (H) mg/dL (calc)   Total CHOL/HDL Ratio 6.2 (H) <5.0 (calc)   Non-HDL Cholesterol (Calc) 167 (H) <130 mg/dL (calc)      Assessment & Plan:   Problem List Items Addressed This Visit     Erectile dysfunction  Relevant  Medications   tadalafil  (CIALIS ) 20 MG tablet   Hyperlipidemia associated with type 2 diabetes mellitus (HCC)   Relevant Medications   tadalafil  (CIALIS ) 20 MG tablet   Major depressive disorder, recurrent episode (HCC)   Relevant Medications   escitalopram  (LEXAPRO ) 20 MG tablet   Pulmonary nodules   Relevant Orders   AMB  Referral to Pulmonary Nodule Clinic   Type 2 diabetes mellitus with hyperglycemia (HCC)   Other Visit Diagnoses       Annual physical exam    -  Primary     Screening for colon cancer       Relevant Orders   Cologuard     Essential hypertension       Relevant Medications   tadalafil  (CIALIS ) 20 MG tablet     Epigastric abdominal pain       Relevant Medications   omeprazole  (PRILOSEC) 40 MG capsule     Long term current use of oral hypoglycemic drug         Former smoker       Relevant Orders   AMB  Referral to Pulmonary Nodule Clinic        Updated Health Maintenance information Reviewed recent lab results with patient Encouraged improvement to lifestyle with diet and exercise Goal of weight loss  Type 2 Diabetes Mellitus Elevated A1c at 11.8 (from 10.3) indicates poor control due to stress eating and non-adherence.  Rybelsus  taken properly it seems - Continue Rybelsus  14mg  and metformin  500mg  x 2 = 1000mg  BID - Encourage adherence to medication regimen.  Major Depression, recurrent moderate Depression worsened by family issues. Lexapro  effective, increase may help. - Increase Lexapro  dosage from 10 mg to 20 mg daily. - Continue therapy sessions.  Pulmonary Nodules Incidental finding on CT. Two small nodules on CT. Former smoker, high risk, requires monitoring. - Refer to pulmonary nodule clinic for follow-up. - Plan for repeat non-contrast low-radiation CT scan in January 2026.  GERD / Epigastric Pain Dull epigastric pain, possibly gastritis or stress-related. Omeprazole  supply may be old. NO longer taking Omeparzole 20mg , needs new  order - Prescribe omeprazole  40 mg daily before breakfast.  Erectile Dysfunction Sildenafil  ineffective. Lexapro  may contribute, but condition pre-existing. Tadalafil  trial warranted. - Discontinue sildenafil  failed high dose trial - Start tadalafil  (Cialis ) 20 mg, one pill max. - If tadalafil  is ineffective, consider referral to urologist.  Hyperlipidemia LDL in 130s due to non-adherence to rosuvastatin . Resuming expected to control levels. - Resume rosuvastatin  20 mg daily for now.  General Health Maintenance Due for colon cancer screening at 43. Discussed pneumonia vaccination guidelines. - Order Cologuard for colon cancer screening. - Consider pneumonia vaccination in the future.  Follow-up Regular follow-up needed for monitoring and adherence. Closer follow-up due to current challenges. - Schedule follow-up appointment in 3 months. - Perform finger prick glucose test at next visit.       Orders Placed This Encounter  Procedures  . Cologuard  . AMB  Referral to Pulmonary Nodule Clinic    Referral Priority:   Routine    Referral Type:   Consultation    Referral Reason:   Specialty Services Required    Number of Visits Requested:   1    Meds ordered this encounter  Medications  . escitalopram  (LEXAPRO ) 20 MG tablet    Sig: Take 1 tablet (20 mg total) by mouth daily.    Dispense:  90 tablet    Refill:  3    Dose increase  10 to 20mg   . tadalafil  (CIALIS ) 20 MG tablet    Sig: Take 1 tablet (20 mg total) by mouth every other day as needed for erectile dysfunction.    Dispense:  10 tablet    Refill:  0    Goodrx  . omeprazole  (PRILOSEC) 40 MG capsule    Sig: Take 1 capsule (40 mg total) by mouth daily before breakfast.    Dispense:  90 capsule    Refill:  3    Requesting 1 year supply     Follow up plan: Return in about 3 months (around 12/02/2023) for 3 month DM A1c.  Domingo Friend, DO [facility] 09/02/2023,  8:13 AM

## 2023-09-02 NOTE — Patient Instructions (Addendum)
 Thank you for coming to the office today.  Dose increase Lexapro  10 to 20mg   Resume Cholesterol Rosuvastatin   Stop Sildenafil  Start Tadalafil , 1 pill max If not effective, we can refer to Urologist.  Repeat Lung CT Screening in January 2026. I can place a referral in advance.  Colon Cancer Screening: - For all adults age 45+ routine colon cancer screening is highly recommended.  Ordered the Cologuard (home kit) test for colon cancer screening. Stay tuned for further updates.  It will be shipped to you directly. If not received in 2-4 weeks, call us  or the company.   If you send it back and no results are received in 2-4 weeks, call us  or the company as well!   Colon Cancer Screening: - For all adults age 51+ routine colon cancer screening is highly recommended.     - Recent guidelines from American Cancer Society recommend starting age of 60 - Early detection of colon cancer is important, because often there are no warning signs or symptoms, also if found early usually it can be cured. Late stage is hard to treat.   - If Cologuard is NEGATIVE, then it is good for 3 years before next due - If Cologuard is POSITIVE, then it is strongly advised to get a Colonoscopy, which allows the GI doctor to locate the source of the cancer or polyp (even very early stage) and treat it by removing it. ------------------------- Follow instructions to collect sample, you may call the company for any help or questions, 24/7 telephone support at [phone].   Please schedule a Follow-up Appointment to: Return in about 3 months (around 12/02/2023) for 3 month DM A1c.  If you have any other questions or concerns, please feel free to call the office or send a message through MyChart. You may also schedule an earlier appointment if necessary.  Additionally, you may be receiving a survey about your experience at our office within a few days to 1 week by e-mail or mail. We value your  feedback.  Domingo Friend, DO [facility] [place], [place]

## 2023-09-06 ENCOUNTER — Other Ambulatory Visit: Payer: Self-pay | Admitting: Family Medicine

## 2023-09-06 DIAGNOSIS — E1165 Type 2 diabetes mellitus with hyperglycemia: Secondary | ICD-10-CM

## 2023-09-07 ENCOUNTER — Encounter: Payer: Self-pay | Admitting: Student in an Organized Health Care Education/Training Program

## 2023-09-07 ENCOUNTER — Ambulatory Visit: Admitting: Student in an Organized Health Care Education/Training Program

## 2023-09-07 VITALS — BP 118/78 | HR 87 | Temp 98.3°F | Ht 71.0 in | Wt 212.2 lb

## 2023-09-07 DIAGNOSIS — F17290 Nicotine dependence, other tobacco product, uncomplicated: Secondary | ICD-10-CM | POA: Diagnosis not present

## 2023-09-07 DIAGNOSIS — R053 Chronic cough: Secondary | ICD-10-CM

## 2023-09-07 DIAGNOSIS — R918 Other nonspecific abnormal finding of lung field: Secondary | ICD-10-CM

## 2023-09-07 DIAGNOSIS — F17200 Nicotine dependence, unspecified, uncomplicated: Secondary | ICD-10-CM

## 2023-09-07 NOTE — Telephone Encounter (Signed)
 Rx 11/03/22 #360 3RF- too soon Requested Prescriptions  Pending Prescriptions Disp Refills   metFORMIN  (GLUCOPHAGE ) 500 MG tablet [Pharmacy Med Name: metFORMIN  HCl 500 MG Oral Tablet] 360 tablet 3    Sig: TAKE 2 TABLETS BY MOUTH TWICE  DAILY WITH MEALS     Endocrinology:  Diabetes - Biguanides Failed - 09/07/2023  2:12 PM      Failed - HBA1C is between 0 and 7.9 and within 180 days    Hgb A1c MFr Bld  Date Value Ref Range Status  08/24/2023 11.8 (H) <5.7 % of total Hgb Final    Comment:    For someone without known diabetes, a hemoglobin A1c value of 6.5% or greater indicates that they may have  diabetes and this should be confirmed with a follow-up  test. . For someone with known diabetes, a value <7% indicates  that their diabetes is well controlled and a value  greater than or equal to 7% indicates suboptimal  control. A1c targets should be individualized based on  duration of diabetes, age, comorbid conditions, and  other considerations. . Currently, no consensus exists regarding use of hemoglobin A1c for diagnosis of diabetes for children. .          Failed - eGFR in normal range and within 360 days    GFR, Est African American  Date Value Ref Range Status  07/16/2020 127 > OR = 60 mL/min/1.23m2 Final   GFR, Est Non African American  Date Value Ref Range Status  07/16/2020 109 > OR = 60 mL/min/1.45m2 Final   eGFR  Date Value Ref Range Status  03/16/2021 112 > OR = 60 mL/min/1.79m2 Final    Comment:    The eGFR is based on the CKD-EPI 2021 equation. To calculate  the new eGFR from a previous Creatinine or Cystatin C result, go to https://www.kidney.org/professionals/ kdoqi/gfr%5Fcalculator          Failed - B12 Level in normal range and within 720 days    No results found for: "VITAMINB12"       Failed - Valid encounter within last 6 months    Recent Outpatient Visits           5 days ago Annual physical exam   [facility]  [place], Kayleen Party, DO              Passed - Cr in normal range and within 360 days    Creat  Date Value Ref Range Status  08/24/2023 0.75 0.60 - 1.29 mg/dL Final   Creatinine, Urine  Date Value Ref Range Status  08/24/2023 64 20 - 320 mg/dL Final         Passed - CBC within normal limits and completed in the last 12 months    WBC  Date Value Ref Range Status  08/24/2023 7.8 3.8 - 10.8 Thousand/uL Final   RBC  Date Value Ref Range Status  08/24/2023 5.58 4.20 - 5.80 Million/uL Final   Hemoglobin  Date Value Ref Range Status  08/24/2023 15.7 13.2 - 17.1 g/dL Final  16/02/9603 54.0 13.0 - 17.7 g/dL Final   HCT  Date Value Ref Range Status  08/24/2023 47.6 38.5 - 50.0 % Final   Hematocrit  Date Value Ref Range Status  09/16/2016 42.8 37.5 - 51.0 % Final   MCHC  Date Value Ref Range Status  08/24/2023 33.0 32.0 - 36.0 g/dL Final    Comment:    For adults, a slight decrease in  the calculated MCHC value (in the range of 30 to 32 g/dL) is most likely not clinically significant; however, it should be interpreted with caution in correlation with other red cell parameters and the patient's clinical condition.    [facility]  Date Value Ref Range Status  08/24/2023 28.1 27.0 - 33.0 pg Final   MCV  Date Value Ref Range Status  08/24/2023 85.3 80.0 - 100.0 fL Final  09/16/2016 83 79 - 97 fL Final   No results found for: "PLTCOUNTKUC", "LABPLAT", "POCPLA" RDW  Date Value Ref Range Status  08/24/2023 11.8 11.0 - 15.0 % Final  09/16/2016 13.1 12.3 - 15.4 % Final

## 2023-09-07 NOTE — Patient Instructions (Signed)
 The [facility] Quitline: Call 1-800-QUIT-NOW ([phone]). The [place] Quitline is a free service for [employer]. Trained counselors are available from 8 am until 3 am, 365 days per year. Services are available in both [place] and [place].   Web Resources Free online support programs can help you track your progress and share experiences with others who are quitting. These are examples: www.becomeanex.org www.trytostop.org  www.smokefree.gov  www.https://www.vargas.com/.aspx  [facility]: offers comprehensive in-person tobacco treatment counseling at [facility] ([address] [place] [address]).  Open to everyone. Virtual appointments available. Free parking. Call [phone] to schedule an appointment or [phone] for general information.    Tobacco Cessation Medications  Nicotine Replacement Therapy (NRT)  Nicotine is the addictive part of tobacco smoke, but not the most dangerous part. There are 7000 other toxins in cigarettes, including carbon monoxide, that cause disease. People do not generally become addicted to medication. Common problems: People don't use enough medication or stop too early. Medications are safe and effective. Overdose is very uncommon. Use medications as long as needed (3 months minimum). Some combinations work better than single medications. Long acting medications like the NRT patch and bupropion provide continuous treatment for withdrawal symptoms.  PLUS  Short acting medications like the NRT gum, lozenge, inhaler, and nasal spray help people to cope with breakthrough cravings.  ? Nicotine Patch  Place patch on hairless skin on upper body, including arms and back. Each day: discard old patch, shower, apply new patch to a different site. Apply hydrocortisone cream to mildly red/irritated areas. Call provider if rash develops. If patch causes sleep disturbance, remove patch  at bedtime and replace each morning after shower. Side effects may include: skin irritation, headache, insomnia, abnormal/vivid dreams.  ? Nicotine Gum  Chew gum slowly, park in cheek when peppery taste or tingling sensation begins (about 15-30 chews). When taste or tingling goes away, begin chewing again. Use until nicotine is gone (taste or tingle does not return, usually 30 minutes). Park in different areas of mouth. Nicotine is absorbed through the lining of the mouth. Use enough to control cravings, up to 24 pieces per day (if used alone). Avoid eating or drinking for 15 minutes before using and during use. Side effects may include: mouth/jaw soreness, hiccups, indigestion, hypersalivation.  If gum is not chewed correctly, additional side effects may include lightheadedness, nausea/vomiting, throat and mouth irritation.  ? Nicotine Lozenge  Allow to dissolve slowly in mouth (20-30 minutes). Do not chew or swallow. Nicotine release may cause a warm tingling sensation. Occasionally rotate to different areas of the mouth. Use enough to control cravings, up to 20 lozenges per day (if used alone). Avoid eating or drinking for 15 minutes before using and during use. Side effects may include: nausea, hiccups, cough, heartburn, headache, gas, insomnia.  ? Nicotine Nasal Spray Use 1 spray in each nostril (1 dose) and tilt head back for 1 minute. Do not sniff, swallow, or inhale through nose.  Use at least 8 doses (1 spray in each nostril) , up to 40 doses per day (if used alone). To reduce nasal irritation, spray on cotton swab and insert into nose. Side effects may include: nasal and/or throat irritation (hot, peppery, or burning sensation), nasal irritation, tearing, sneezing, cough, headache.  ? Nicotine Oral Inhaler (puffer) Inhale into the back of the throat or puff in short breaths. Do not inhale into the lungs.  Puff continuously for 20 minutes (about 80 puffs) until cartridge  is  empty. Change cartridge when it loses the "burning in throat" sensation (feels like air only). Open cartridges can be saved and used again within 24 hours. Use at least 6 and up to 16 cartridges per day (if used alone).  Avoid eating or drinking for 15 minutes before using and during use. Side effects may include: mouth and/or throat irritation, unpleasant taste, cough, nasal irritation, indigestion, hiccups, headache.  ? Chantix (varenicline) Days 1-3: Take one 0.5 mg white pill each morning for 3 days, one week before quit date. Days 4-7: Increase to one 0.5 mg white pill twice a day in morning and evening for 4 days.  On Day 8 (target quit date), increase to one 1 mg blue pill twice a day. Maintain this dose for a minimum of 3 months. Take with food and a full glass of water to reduce nausea. Be sure that the two doses are at least 8 hours apart, but try to take second dose early in the evening (i.e. 6 pm) to avoid sleep problems. Common side effects include: nausea, insomnia, headache, abnormal/vivid dreams. Tell your doctor if you have any history of psychiatric illness prior to starting Chantix.  STOP taking CHANTIX and contact a healthcare provider immediately if you experience agitation, hostility, depressed mood, changes in thoughts or behavior that are not typical for you, thinking about or attempting suicide, allergic or skin reactions including swelling, rash, redness, or peeling of the skin.  For patients who have heart disease: Smoking is a major risk factor for cardiovascular disease, and Chantix can help you quit smoking. Chantix may be associated with a small, increased risk of certain heart events in patients who have heart disease. If you have any new or worsening symptoms of heart disease while taking Chantix, such as shortness of breath or trouble breathing, new or worsening chest pain, or new or worsening pain in your legs when walking, call your doctor or get emergency medical  help immediately.  ? Wellbutrin / Zyban (bupropion) Take one 150 mg pill each morning for 3 days, one week before target quit date. On Day 4, increase to one 150 mg pill twice a day, morning and evening.  Maintain this dose for a minimum of 3 months. Be sure that the two doses are at least 8 hours apart, but try to take second dose early in the evening (i.e. 6 pm) to avoid sleep problems. Avoid or minimize use of alcohol when taking this medication. Common side effects include: dry mouth, headache, insomnia, nausea, weight loss.  Risk of seizure is 05/998. STOP taking BUPROPION and contact a healthcare provider immediately if you experience agitation, hostility, depressed mood, changes in thoughts or behavior that are not typical for you, thinking about or attempting suicide, allergic or skin reactions including swelling, rash, redness, or peeling of the skin.

## 2023-09-07 NOTE — Progress Notes (Addendum)
 Assessment & Plan:   1. Pulmonary nodules (Primary)  Has 2 very small pulmonary nodules in the bilateral lower lobes which I suspect are secondary to previous infections or previous exposures to dust. That said, the CT of the coronaries is an incomplete scan and does not evaluate the rest of his lung parenchyma. He has significant exposure history with heavy smoking in the past and current vaping, and would benefit from a dedicated chest CT to fully evaluate his lung parenchyma. We will order the CT and time it at the 6 month mark.  If this shows no other pulmonary nodules and shows the nodules in question to be stable, we will stop any further radiographic monitoring.  - CT CHEST WO CONTRAST; Future  2. Chronic cough  Reports a chronic cough for a few years that is productive of sputum and is mostly present in the morning. This is concerning for bronchitis with the potential for COPD. We will obtain a pulmonary function test to evaluate spirometry for any signs of obstruction.  - Pulmonary function test; Future  3. Tobacco use disorder  Has a history of smoking starting at a very young age (43 years old) with subsequent switch to vape use at 15.  I did counsel the patient at length regarding the importance of smoking cessation.  He knows that he needs to quit but is not ready.  I provided him with an informational packet and we will revisit this at follow-up.  - Pulmonary function test; Future   Return in about 6 months (around 03/08/2024).  I spent 64 minutes caring for this patient today, including preparing to see the patient, obtaining a medical history , reviewing a separately obtained history, performing a medically appropriate examination and/or evaluation, counseling and educating the patient/family/caregiver, ordering medications, tests, or procedures, documenting clinical information in the electronic health record, and independently interpreting results (not separately  reported/billed) and communicating results to the patient/family/caregiver. 3 minutes utilized to counsel the patient regarding smoking cessation.  Vergia Glasgow, MD [facility]  End of visit medications:  No orders of the defined types were placed in this encounter.    Current Outpatient Medications:    amLODipine  (NORVASC ) 10 MG tablet, Take 1 tablet (10 mg total) by mouth daily., Disp: 90 tablet, Rfl: 1   Blood Glucose Calibration (OT ULTRA/FASTTK CNTRL SOLN) SOLN, Use to check blood sugar twice a day, Disp: 1 each, Rfl: 0   Blood Glucose Monitoring Suppl (ONE TOUCH ULTRA 2) w/Device KIT, Use to check blood sugar twice a day, Disp: 1 kit, Rfl: 0   escitalopram  (LEXAPRO ) 20 MG tablet, Take 1 tablet (20 mg total) by mouth daily., Disp: 90 tablet, Rfl: 3   fluticasone  (FLONASE ) 50 MCG/ACT nasal spray, Place 2 sprays into both nostrils daily., Disp: 16 g, Rfl: 6   Lancets (ONETOUCH ULTRASOFT) lancets, Use to check blood sugar twice a day, Disp: 200 each, Rfl: 12   metFORMIN  (GLUCOPHAGE ) 500 MG tablet, TAKE 2 TABLETS BY MOUTH  TWICE DAILY WITH A MEAL, Disp: 360 tablet, Rfl: 3   omeprazole  (PRILOSEC) 40 MG capsule, Take 1 capsule (40 mg total) by mouth daily before breakfast., Disp: 90 capsule, Rfl: 3   ondansetron  (ZOFRAN  ODT) 4 MG disintegrating tablet, Take 1 tablet (4 mg total) by mouth every 8 (eight) hours as needed for nausea or vomiting., Disp: 30 tablet, Rfl: 0   ONETOUCH ULTRA test strip, Use to check blood sugar twice a day, Disp: 200 each, Rfl:  12   rosuvastatin  (CRESTOR ) 20 MG tablet, TAKE 1 TABLET BY MOUTH AT  BEDTIME, Disp: 90 tablet, Rfl: 3   RYBELSUS  14 MG TABS, Take 1 tablet (14 mg total) by mouth daily before breakfast., Disp: 90 tablet, Rfl: 1   SUMAtriptan  (IMITREX ) 25 MG tablet, Take 1-2 tablets (25-50 mg total) by mouth once as needed for up to 1 dose for migraine. May repeat dose in 2 hr if HA persists = max 24 hr, Disp: 12 tablet, Rfl: 1   tadalafil   (CIALIS ) 20 MG tablet, Take 1 tablet (20 mg total) by mouth every other day as needed for erectile dysfunction., Disp: 10 tablet, Rfl: 0   traZODone  (DESYREL ) 100 MG tablet, TAKE 1 TABLET BY MOUTH AT  BEDTIME, Disp: 90 tablet, Rfl: 1   Subjective:   PATIENT ID: Curtis Parrish GENDER: male DOB: July 17, 1980, MRN: [id number]  Chief Complaint  Patient presents with   New Patient (Initial Visit)    Reason for Referral: referral to Pulmonary Nodule Clinic for LDCT monitoring, next due 05/2024. He had incidental finding on Coronary CT Scan. Results Two tiny subpleural lower lobe pulmonary nodules. Non-contrast chest CT can be considered in 12 months if patient is high-risk. Patient has long smoking history. Currently smoke free.  Across his diaphragm hurts him and he says it feels like when he had walking pneumonia as a child. Cough everyday, and no inhalers for cough. Occasionally SOB and DOE.      HPI  Patient is a pleasant 43 year old male with a past medical history of hypertension and diabetes who presents for the evaluation of a pulmonary nodule noted on a coronary CT scan.  Patient was in his usual state of health and was followed by his primary care physician for his multiple medical problems.  He underwent a CT scan of the coronaries for risk stratification which was noted for 2 subcentimeter nodules measuring 2 mm and 3 mm in the lower lobes for which he is referred to us .  Patient does not have any shortness of breath but does report a morning "smoker's" cough.  He does cough up phlegm when he wakes up and this is sometimes intense enough to cause emesis. Following this, he does not have any symptoms throughout the day. He doesn't have any wheeze, shortness of breath, chest pain, or chest tightness.  He reports smoking cigarettes starting at the age of 53 until the age of 48, of around 1 pack a day.  He then switched to vaping and has been doing so for around 18 years. He then switched to  vaping and he currently reports heavy use of his nicotine vape. 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  He does report some exposure to burn pits, depleted uranium in the tank armor, and dust in the desert.  Ancillary information including prior medications, full medical/surgical/family/social histories, and PFTs (when available) are listed below and have been reviewed.   Review of Systems  Constitutional:  Negative for chills, fever, malaise/fatigue and weight loss.  Respiratory:  Positive for cough. Negative for hemoptysis, sputum production, shortness of breath and wheezing.   Cardiovascular:  Negative for chest pain and palpitations.     Objective:   Vitals:   09/07/23 0850  BP: 118/78  Pulse: 87  Temp: 98.3 F (36.8 C)  TempSrc: Oral  SpO2: 99%  Weight: 212 lb 3.2 oz (96.3 kg)  Height: 5\' 11"  (1.803 m)   99% on RA  BMI Readings from Last 3 Encounters:  09/07/23 29.60 kg/m  09/02/23 30.44 kg/m  05/24/23 29.84 kg/m   Wt Readings from Last 3 Encounters:  09/07/23 212 lb 3.2 oz (96.3 kg)  09/02/23 212 lb 2 oz (96.2 kg)  05/24/23 208 lb (94.3 kg)    Physical Exam Constitutional:      Appearance: Normal appearance.  Cardiovascular:     Rate and Rhythm: Normal rate and regular rhythm.     Pulses: Normal pulses.     Heart sounds: Normal heart sounds.  Pulmonary:     Effort: Pulmonary effort is normal.     Breath sounds: Normal breath sounds.  Neurological:     General: No focal deficit present.     Mental Status: He is alert and oriented to person, place, and time. Mental status is at baseline.       Ancillary Information    Past Medical History:  Diagnosis Date   Anxiety    Depression    Hypertension      Family History  Problem Relation Age of  Onset   Diabetes Mother    Prostate cancer Father    Gastric cancer Father    Hypertension Sister      History reviewed. No pertinent surgical history.  Social History   Socioeconomic History   Marital status: Married    Spouse name: Not on file   Number of children: Not on file   Years of education: Not on file   Highest education level: Associate degree: occupational, Scientist, product/process development, or vocational program  Occupational History   Not on file  Tobacco Use   Smoking status: Former    Current packs/day: 0.00    Average packs/day: 1 pack/day for 30.0 years (30.0 ttl pk-yrs)    Types: Cigarettes    Quit date: 2019    Years since quitting: 6.3   Smokeless tobacco: Former   Tobacco comments:    Smoke 1 PPD from 43yrs old        Currently vaping  Vaping Use   Vaping status: Every Day   Substances: Nicotine  Substance and Sexual Activity   Alcohol use: No    Comment: Abstaining from alcohol for 15 years, prior history in age 60s   Drug use: No   Sexual activity: Not on file  Other Topics Concern   Not on file  Social History Narrative   Not on file   Social Drivers of Health   Financial Resource Strain: Low Risk  (05/24/2023)   Overall Financial Resource Strain (CARDIA)    Difficulty of Paying Living Expenses: Not very hard  Food Insecurity: No Food Insecurity (05/24/2023)   Hunger Vital Sign    Worried About Running Out of Food in the Last Year: Never true    Ran Out of Food in the Last Year: Never true  Transportation Needs: No Transportation Needs (05/24/2023)   PRAPARE - Administrator, Civil Service (Medical): No    Lack of Transportation (Non-Medical): No  Physical Activity: Insufficiently Active (05/24/2023)   Exercise Vital Sign    Days of Exercise per Week: 1 day    Minutes of Exercise per Session: 30 min  Stress: Stress Concern Present (05/24/2023)   [employer] of Occupational Health - Occupational Stress Questionnaire    Feeling of Stress :  To some  extent  Social Connections: Socially Isolated (05/24/2023)   Social Connection and Isolation Panel [NHANES]    Frequency of Communication with Friends and Family: Once a week    Frequency of Social Gatherings with Friends and Family: Once a week    Attends Religious Services: Never    Database administrator or Organizations: No    Attends Engineer, structural: Not on file    Marital Status: Married  Intimate Partner Violence: Not on file     Allergies  Allergen Reactions   Aspirin Hives   Nsaids Hives     CBC    Component Value Date/Time   WBC 7.8 08/24/2023 0830   RBC 5.58 08/24/2023 0830   HGB 15.7 08/24/2023 0830   HGB 15.0 09/16/2016 1030   HCT 47.6 08/24/2023 0830   HCT 42.8 09/16/2016 1030   PLT 177 08/24/2023 0830   PLT 296 09/16/2016 1030   MCV 85.3 08/24/2023 0830   MCV 83 09/16/2016 1030   MCH 28.1 08/24/2023 0830   MCHC 33.0 08/24/2023 0830   RDW 11.8 08/24/2023 0830   RDW 13.1 09/16/2016 1030   LYMPHSABS 2,920 12/05/2019 0848   LYMPHSABS 3.4 (H) 09/16/2016 1030   MONOABS 1.4 (H) 02/03/2016 1150   EOSABS 273 08/24/2023 0830   EOSABS 0.3 09/16/2016 1030   BASOSABS 109 08/24/2023 0830   BASOSABS 0.1 09/16/2016 1030    Pulmonary Functions Testing Results:     No data to display          Outpatient Medications Prior to Visit  Medication Sig Dispense Refill   amLODipine  (NORVASC ) 10 MG tablet Take 1 tablet (10 mg total) by mouth daily. 90 tablet 1   Blood Glucose Calibration (OT ULTRA/FASTTK CNTRL SOLN) SOLN Use to check blood sugar twice a day 1 each 0   Blood Glucose Monitoring Suppl (ONE TOUCH ULTRA 2) w/Device KIT Use to check blood sugar twice a day 1 kit 0   escitalopram  (LEXAPRO ) 20 MG tablet Take 1 tablet (20 mg total) by mouth daily. 90 tablet 3   fluticasone  (FLONASE ) 50 MCG/ACT nasal spray Place 2 sprays into both nostrils daily. 16 g 6   Lancets (ONETOUCH ULTRASOFT) lancets Use to check blood sugar twice a day 200 each 12    metFORMIN  (GLUCOPHAGE ) 500 MG tablet TAKE 2 TABLETS BY MOUTH  TWICE DAILY WITH A MEAL 360 tablet 3   omeprazole  (PRILOSEC) 40 MG capsule Take 1 capsule (40 mg total) by mouth daily before breakfast. 90 capsule 3   ondansetron  (ZOFRAN  ODT) 4 MG disintegrating tablet Take 1 tablet (4 mg total) by mouth every 8 (eight) hours as needed for nausea or vomiting. 30 tablet 0   ONETOUCH ULTRA test strip Use to check blood sugar twice a day 200 each 12   rosuvastatin  (CRESTOR ) 20 MG tablet TAKE 1 TABLET BY MOUTH AT  BEDTIME 90 tablet 3   RYBELSUS  14 MG TABS Take 1 tablet (14 mg total) by mouth daily before breakfast. 90 tablet 1   SUMAtriptan  (IMITREX ) 25 MG tablet Take 1-2 tablets (25-50 mg total) by mouth once as needed for up to 1 dose for migraine. May repeat dose in 2 hr if HA persists = max 24 hr 12 tablet 1   tadalafil  (CIALIS ) 20 MG tablet Take 1 tablet (20 mg total) by mouth every other day as needed for erectile dysfunction. 10 tablet 0   traZODone  (DESYREL ) 100 MG tablet TAKE 1 TABLET BY MOUTH AT  BEDTIME 90 tablet 1   No facility-administered medications prior to visit.

## 2023-09-29 ENCOUNTER — Encounter: Payer: Self-pay | Admitting: Family Medicine

## 2023-09-29 DIAGNOSIS — N529 Male erectile dysfunction, unspecified: Secondary | ICD-10-CM

## 2023-09-29 DIAGNOSIS — R361 Hematospermia: Secondary | ICD-10-CM

## 2023-10-01 LAB — HM DIABETES EYE EXAM

## 2023-10-31 ENCOUNTER — Other Ambulatory Visit: Payer: Self-pay | Admitting: Family Medicine

## 2023-10-31 DIAGNOSIS — F5101 Primary insomnia: Secondary | ICD-10-CM

## 2023-11-02 ENCOUNTER — Ambulatory Visit: Admitting: Urology

## 2023-11-03 NOTE — Telephone Encounter (Signed)
 Requested Prescriptions  Pending Prescriptions Disp Refills   traZODone  (DESYREL ) 100 MG tablet [Pharmacy Med Name: traZODone  HCl 100 MG Oral Tablet] 90 tablet 0    Sig: TAKE 1 TABLET BY MOUTH AT  BEDTIME     Psychiatry: Antidepressants - Serotonin Modulator Failed - 11/03/2023  9:54 AM      Failed - Valid encounter within last 6 months    Recent Outpatient Visits           2 months ago Annual physical exam   [place] [facility] Raina Bunting, DO       Future Appointments             In 3 weeks Stoioff, Kizzie Perks, MD [facility] [place]            Passed - Completed PHQ-2 or PHQ-9 in the last 360 days

## 2023-11-16 ENCOUNTER — Other Ambulatory Visit: Payer: Self-pay | Admitting: Family Medicine

## 2023-11-16 DIAGNOSIS — I10 Essential (primary) hypertension: Secondary | ICD-10-CM

## 2023-11-18 NOTE — Telephone Encounter (Signed)
 Requested Prescriptions  Pending Prescriptions Disp Refills   amLODipine  (NORVASC ) 10 MG tablet [Pharmacy Med Name: amLODIPine  Besylate 10 MG Oral Tablet] 90 tablet 1    Sig: TAKE 1 TABLET BY MOUTH DAILY     Cardiovascular: Calcium  Channel Blockers 2 Passed - 11/18/2023  6:17 PM      Passed - Last BP in normal range    BP Readings from Last 1 Encounters:  09/07/23 118/78         Passed - Last Heart Rate in normal range    Pulse Readings from Last 1 Encounters:  09/07/23 87         Passed - Valid encounter within last 6 months    Recent Outpatient Visits           2 months ago Annual physical exam   [place] [facility] [place], Marsa PARAS, DO       Future Appointments             In 1 week Stoioff, Glendia BROCKS, MD [facility]

## 2023-11-30 ENCOUNTER — Encounter: Payer: Self-pay | Admitting: Urology

## 2023-11-30 ENCOUNTER — Ambulatory Visit (INDEPENDENT_AMBULATORY_CARE_PROVIDER_SITE_OTHER): Admitting: Urology

## 2023-11-30 VITALS — BP 134/81 | HR 94 | Ht 70.0 in | Wt 207.0 lb

## 2023-11-30 DIAGNOSIS — N529 Male erectile dysfunction, unspecified: Secondary | ICD-10-CM

## 2023-11-30 DIAGNOSIS — R868 Other abnormal findings in specimens from male genital organs: Secondary | ICD-10-CM

## 2023-11-30 DIAGNOSIS — R7989 Other specified abnormal findings of blood chemistry: Secondary | ICD-10-CM

## 2023-11-30 NOTE — Progress Notes (Signed)
 I, Curtis Parrish, acting as a scribe for Curtis JAYSON Barba, MD., have documented all relevant documentation on the behalf of Curtis JAYSON Barba, MD, as directed by Curtis JAYSON Barba, MD while in the presence of Curtis JAYSON Barba, MD.  11/30/2023 5:45 PM   Curtis Parrish May 12, 1981 [id number]  Referring provider: Edman Marsa PARAS, DO [address] [place],  [place] [address]  Chief Complaint  Patient presents with   Erectile Dysfunction    HPI: Curtis Parrish is a 43 y.o. male referred for evaluation of erectile dysfunction.   States he was diagnosed with diabetes approximately 7 years ago and had onset of ED at that time. He states he has not been sexually active for the last 7 years. He has partial erections which are almost never firm enough for penetration, and the times he does achieve penetration, he has extreme difficulty maintaining the erection. His SHIM was 5/25 indicating severe ED. He does note tiredness, fatigue, and decreased libido. Although not sexually active, he has masturbated on occasions. For the past year, he has not noticed any nocturnal erections. He did try sildenafil , which had no improvement, and also had tried tadalafil  20 mg and noted slight tumescence but no rigidity.  When he has masturbated recently, he states his semen is brownish-gray in color. He has not seen any frank blood or blood streaking in his semen.  He has no pain with ejaculation.  PSA 08/24/23 was 0.62 Organic risk factors include hyperlipidemia, diabetes, SSRI medication, hypertension, and antihypertensive medication.    PMH: Past Medical History:  Diagnosis Date   Anxiety    Depression    Hypertension     Home Medications:  Allergies as of 11/30/2023       Reactions   Aspirin Hives   Nsaids Hives        Medication List        Accurate as of November 30, 2023  5:45 PM. If you have any questions, ask your nurse or doctor.          STOP taking these medications    ondansetron   4 MG disintegrating tablet Commonly known as: Zofran  ODT Stopped by: Curtis Parrish       TAKE these medications    amLODipine  10 MG tablet Commonly known as: NORVASC  TAKE 1 TABLET BY MOUTH DAILY   escitalopram  20 MG tablet Commonly known as: LEXAPRO  Take 1 tablet (20 mg total) by mouth daily.   fluticasone  50 MCG/ACT nasal spray Commonly known as: FLONASE  Place 2 sprays into both nostrils daily.   metFORMIN  500 MG tablet Commonly known as: GLUCOPHAGE  TAKE 2 TABLETS BY MOUTH  TWICE DAILY WITH A MEAL   omeprazole  40 MG capsule Commonly known as: PRILOSEC Take 1 capsule (40 mg total) by mouth daily before breakfast.   ONE TOUCH ULTRA 2 w/Device Kit Use to check blood sugar twice a day   OneTouch Ultra test strip Generic drug: glucose blood Use to check blood sugar twice a day   onetouch ultrasoft lancets Use to check blood sugar twice a day   OT ULTRA/FASTTK CNTRL SOLN Soln Use to check blood sugar twice a day   rosuvastatin  20 MG tablet Commonly known as: CRESTOR  TAKE 1 TABLET BY MOUTH AT  BEDTIME   Rybelsus  14 MG Tabs Generic drug: Semaglutide  Take 1 tablet (14 mg total) by mouth daily before breakfast.   SUMAtriptan  25 MG tablet Commonly known as: IMITREX  Take 1-2 tablets (25-50 mg  total) by mouth once as needed for up to 1 dose for migraine. May repeat dose in 2 hr if HA persists = max 24 hr   tadalafil  20 MG tablet Commonly known as: CIALIS  Take 1 tablet (20 mg total) by mouth every other day as needed for erectile dysfunction.   traZODone  100 MG tablet Commonly known as: DESYREL  TAKE 1 TABLET BY MOUTH AT  BEDTIME        Allergies:  Allergies  Allergen Reactions   Aspirin Hives   Nsaids Hives    Family History: Family History  Problem Relation Age of Onset   Diabetes Mother    Prostate cancer Father    Gastric cancer Father    Hypertension Sister     Social History:  reports that he quit smoking about 6 years ago. His smoking use  included cigarettes. He has a 30 pack-year smoking history. He has quit using smokeless tobacco. He reports that he does not drink alcohol and does not use drugs.   Physical Exam: BP 134/81   Pulse 94   Ht 5' 10 (1.778 m)   Wt 207 lb (93.9 kg)   BMI 29.70 kg/m   Constitutional:  Alert and oriented, No acute distress. HEENT: [place] AT, moist mucus membranes.  Trachea midline, no masses. Cardiovascular: No clubbing, cyanosis, or edema. Respiratory: Normal respiratory effort, no increased work of breathing. GI: Abdomen is soft, nontender, nondistended, no abdominal masses GU: Phallus without lesion. Testes descended bilaterally without masses or tenderness. Prostate 35 grams, smooth without nodules.  Skin: No rashes, bruises or suspicious lesions. Neurologic: Grossly intact, no focal deficits, moving all 4 extremities. Psychiatric: Normal mood and affect.   Assessment & Plan:    1. Erectile dysfunction Significant organic risk factors PDE5 inhibitor refractory. Check serum testosterone level If he does have hypogonadism, we discuss treatment would not resolve his ED, however may improve the efficacy of PDE5 inhibitors.  If testosterone level is normal, we discussed second-line option of intracavernosal injections, as he does desire to become sexually active.   2. Discolored semen He has a normal PSA and benign DRE. He was reassured this is not a significant problem, he has not had frank hematospermia.  [facility] [address], [address] [place], [place] [address] [phone]

## 2023-11-30 NOTE — Addendum Note (Signed)
 Encounter addended by: Remus Noemi PARAS on: 11/30/2023 11:06 AM  Actions taken: Order Reconciliation Section accessed

## 2023-11-30 NOTE — Patient Instructions (Signed)
 [email or web address]  For injection

## 2023-12-01 ENCOUNTER — Encounter: Payer: Self-pay | Admitting: Urology

## 2023-12-02 LAB — TESTOSTERONE: Testosterone: 449 ng/dL (ref 264–916)

## 2023-12-03 ENCOUNTER — Ambulatory Visit: Payer: Self-pay | Admitting: Urology

## 2023-12-23 ENCOUNTER — Ambulatory Visit: Admitting: Family Medicine

## 2024-01-02 NOTE — Addendum Note (Signed)
 Addended by: REMUS NOEMI PARAS on: 01/02/2024 09:38 AM   Modules accepted: Orders

## 2024-01-07 ENCOUNTER — Other Ambulatory Visit: Payer: Self-pay | Admitting: Family Medicine

## 2024-01-07 DIAGNOSIS — F5101 Primary insomnia: Secondary | ICD-10-CM

## 2024-01-09 NOTE — Telephone Encounter (Signed)
 Requested Prescriptions  Pending Prescriptions Disp Refills   traZODone  (DESYREL ) 100 MG tablet [Pharmacy Med Name: traZODone  HCl 100 MG Oral Tablet] 90 tablet 0    Sig: TAKE 1 TABLET BY MOUTH AT  BEDTIME     Psychiatry: Antidepressants - Serotonin Modulator Passed - 01/09/2024  4:12 PM      Passed - Completed PHQ-2 or PHQ-9 in the last 360 days      Passed - Valid encounter within last 6 months    Recent Outpatient Visits           4 months ago Annual physical exam   [facility], Marsa PARAS, DO

## 2024-01-17 ENCOUNTER — Ambulatory Visit: Admitting: Family Medicine

## 2024-02-11 ENCOUNTER — Other Ambulatory Visit: Payer: Self-pay | Admitting: Family Medicine

## 2024-02-11 DIAGNOSIS — E1169 Type 2 diabetes mellitus with other specified complication: Secondary | ICD-10-CM

## 2024-02-14 NOTE — Telephone Encounter (Signed)
 Requested Prescriptions  Refused Prescriptions Disp Refills   rosuvastatin  (CRESTOR ) 20 MG tablet [Pharmacy Med Name: Rosuvastatin  Calcium  20 MG Oral Tablet] 90 tablet 3    Sig: TAKE 1 TABLET BY MOUTH AT  BEDTIME     Cardiovascular:  Antilipid - Statins 2 Failed - 02/14/2024 12:05 PM      Failed - Lipid Panel in normal range within the last 12 months    Cholesterol, Total  Date Value Ref Range Status  09/16/2016 187 100 - 199 mg/dL Final   Cholesterol  Date Value Ref Range Status  08/24/2023 199 <200 mg/dL Final   LDL Cholesterol (Calc)  Date Value Ref Range Status  08/24/2023 135 (H) mg/dL (calc) Final    Comment:    Reference range: <100 . Desirable range <100 mg/dL for primary prevention;   <70 mg/dL for patients with CHD or diabetic patients  with > or = 2 CHD risk factors. SABRA LDL-C is now calculated using the Martin-Hopkins  calculation, which is a validated novel method providing  better accuracy than the Friedewald equation in the  estimation of LDL-C.  Gladis APPLETHWAITE et al. SANDREA. 7986;689(80): 2061-2068  (http://education.[facility].com/faq/FAQ164)    HDL  Date Value Ref Range Status  08/24/2023 32 (L) > OR = 40 mg/dL Final  94/96/7981 31 (L) >39 mg/dL Final   Triglycerides  Date Value Ref Range Status  08/24/2023 187 (H) <150 mg/dL Final         Passed - Cr in normal range and within 360 days    Creat  Date Value Ref Range Status  08/24/2023 0.75 0.60 - 1.29 mg/dL Final   Creatinine, Urine  Date Value Ref Range Status  08/24/2023 64 20 - 320 mg/dL Final         Passed - Patient is not pregnant      Passed - Valid encounter within last 12 months    Recent Outpatient Visits           5 months ago Annual physical exam   [place] [facility] [place], Marsa PARAS, DO

## 2024-02-27 ENCOUNTER — Other Ambulatory Visit: Payer: Self-pay | Admitting: Family Medicine

## 2024-02-27 DIAGNOSIS — E1169 Type 2 diabetes mellitus with other specified complication: Secondary | ICD-10-CM

## 2024-03-01 NOTE — Telephone Encounter (Signed)
 Requested by interface surescripts.  Requested Prescriptions  Pending Prescriptions Disp Refills   rosuvastatin  (CRESTOR ) 20 MG tablet [Pharmacy Med Name: Rosuvastatin  Calcium  20 MG Oral Tablet] 90 tablet 1    Sig: TAKE 1 TABLET BY MOUTH AT  BEDTIME     Cardiovascular:  Antilipid - Statins 2 Failed - 03/01/2024 11:12 AM      Failed - Lipid Panel in normal range within the last 12 months    Cholesterol, Total  Date Value Ref Range Status  09/16/2016 187 100 - 199 mg/dL Final   Cholesterol  Date Value Ref Range Status  08/24/2023 199 <200 mg/dL Final   LDL Cholesterol (Calc)  Date Value Ref Range Status  08/24/2023 135 (H) mg/dL (calc) Final    Comment:    Reference range: <100 . Desirable range <100 mg/dL for primary prevention;   <70 mg/dL for patients with CHD or diabetic patients  with > or = 2 CHD risk factors. SABRA LDL-C is now calculated using the Martin-Hopkins  calculation, which is a validated novel method providing  better accuracy than the Friedewald equation in the  estimation of LDL-C.  Gladis APPLETHWAITE et al. SANDREA. 7986;689(80): 2061-2068  (http://education.[facility].com/faq/FAQ164)    HDL  Date Value Ref Range Status  08/24/2023 32 (L) > OR = 40 mg/dL Final  94/96/7981 31 (L) >39 mg/dL Final   Triglycerides  Date Value Ref Range Status  08/24/2023 187 (H) <150 mg/dL Final         Passed - Cr in normal range and within 360 days    Creat  Date Value Ref Range Status  08/24/2023 0.75 0.60 - 1.29 mg/dL Final   Creatinine, Urine  Date Value Ref Range Status  08/24/2023 64 20 - 320 mg/dL Final         Passed - Patient is not pregnant      Passed - Valid encounter within last 12 months    Recent Outpatient Visits           6 months ago Annual physical exam   [place] [facility] [place], Marsa PARAS, [place]

## 2024-03-12 ENCOUNTER — Ambulatory Visit: Admitting: Student in an Organized Health Care Education/Training Program

## 2024-03-12 ENCOUNTER — Encounter

## 2024-04-10 ENCOUNTER — Other Ambulatory Visit: Payer: Self-pay | Admitting: Family Medicine

## 2024-04-10 DIAGNOSIS — I10 Essential (primary) hypertension: Secondary | ICD-10-CM

## 2024-04-10 DIAGNOSIS — F5101 Primary insomnia: Secondary | ICD-10-CM

## 2024-04-10 NOTE — Telephone Encounter (Signed)
 Requested Prescriptions  Pending Prescriptions Disp Refills   amLODipine  (NORVASC ) 10 MG tablet [Pharmacy Med Name: amLODIPine  Besylate 10 MG Oral Tablet] 30 tablet 0    Sig: TAKE 1 TABLET BY MOUTH DAILY     Cardiovascular: Calcium  Channel Blockers 2 Failed - 04/10/2024  5:09 PM      Failed - Valid encounter within last 6 months    Recent Outpatient Visits           7 months ago Annual physical exam   [place] [facility] [place], Marsa PARAS, DO              Passed - Last BP in normal range    BP Readings from Last 1 Encounters:  11/30/23 134/81         Passed - Last Heart Rate in normal range    Pulse Readings from Last 1 Encounters:  11/30/23 94          traZODone  (DESYREL ) 100 MG tablet [Pharmacy Med Name: traZODone  HCl 100 MG Oral Tablet] 30 tablet 0    Sig: TAKE 1 TABLET BY MOUTH AT  BEDTIME     Psychiatry: Antidepressants - Serotonin Modulator Failed - 04/10/2024  5:09 PM      Failed - Valid encounter within last 6 months    Recent Outpatient Visits           7 months ago Annual physical exam   [place] [facility] [place], Marsa PARAS, DO              Passed - Completed PHQ-2 or PHQ-9 in the last 360 days       Courtesy refill. Patient will need an office visit for additional refills.

## 2024-05-21 ENCOUNTER — Other Ambulatory Visit: Payer: Self-pay | Admitting: Family Medicine

## 2024-05-21 DIAGNOSIS — F5101 Primary insomnia: Secondary | ICD-10-CM

## 2024-05-21 DIAGNOSIS — I10 Essential (primary) hypertension: Secondary | ICD-10-CM

## 2024-05-23 NOTE — Telephone Encounter (Signed)
 Courtesy refill given, appointment needed.   Requested Prescriptions  Pending Prescriptions Disp Refills   traZODone  (DESYREL ) 100 MG tablet [Pharmacy Med Name: traZODone  HCl 100 MG Oral Tablet] 30 tablet 11    Sig: TAKE 1 TABLET BY MOUTH AT  BEDTIME     Psychiatry: Antidepressants - Serotonin Modulator Failed - 05/23/2024 11:56 AM      Failed - Valid encounter within last 6 months    Recent Outpatient Visits           8 months ago Annual physical exam   [place] [facility] [place], Alexander J, DO              Passed - Completed PHQ-2 or PHQ-9 in the last 360 days       amLODipine  (NORVASC ) 10 MG tablet [Pharmacy Med Name: amLODIPine  Besylate 10 MG Oral Tablet] 30 tablet 11    Sig: TAKE 1 TABLET BY MOUTH DAILY     Cardiovascular: Calcium  Channel Blockers 2 Failed - 05/23/2024 11:56 AM      Failed - Valid encounter within last 6 months    Recent Outpatient Visits           8 months ago Annual physical exam   [place] [facility] [place], Marsa PARAS, DO              Passed - Last BP in normal range    BP Readings from Last 1 Encounters:  11/30/23 134/81         Passed - Last Heart Rate in normal range    Pulse Readings from Last 1 Encounters:  11/30/23 94
